# Patient Record
Sex: Female | Born: 1985 | Race: Black or African American | Hispanic: No | Marital: Married | Smoking: Never smoker
Health system: Southern US, Community
[De-identification: ages and names within clinical notes are randomized; demographics above are authoritative.]

## PROBLEM LIST (undated history)

## (undated) ENCOUNTER — Inpatient Hospital Stay (HOSPITAL_COMMUNITY): Payer: Self-pay

## (undated) DIAGNOSIS — J45909 Unspecified asthma, uncomplicated: Secondary | ICD-10-CM

## (undated) DIAGNOSIS — T8859XA Other complications of anesthesia, initial encounter: Secondary | ICD-10-CM

## (undated) HISTORY — PX: NO PAST SURGERIES: SHX2092

---

## 2007-08-20 ENCOUNTER — Ambulatory Visit: Payer: Self-pay | Admitting: Family Medicine

## 2007-09-05 ENCOUNTER — Ambulatory Visit: Payer: Self-pay | Admitting: Family Medicine

## 2012-02-20 ENCOUNTER — Encounter (HOSPITAL_COMMUNITY): Payer: Self-pay | Admitting: Emergency Medicine

## 2012-02-20 ENCOUNTER — Emergency Department (INDEPENDENT_AMBULATORY_CARE_PROVIDER_SITE_OTHER)
Admission: EM | Admit: 2012-02-20 | Discharge: 2012-02-20 | Disposition: A | Discharge status: Home / Self Care | Source: Home / Self Care

## 2012-02-20 DIAGNOSIS — B349 Viral infection, unspecified: Secondary | ICD-10-CM

## 2012-02-20 DIAGNOSIS — B9789 Other viral agents as the cause of diseases classified elsewhere: Secondary | ICD-10-CM

## 2012-02-20 DIAGNOSIS — J111 Influenza due to unidentified influenza virus with other respiratory manifestations: Secondary | ICD-10-CM

## 2012-02-20 MED ORDER — PHENYLEPHRINE-CHLORPHEN-DM 10-4-12.5 MG/5ML PO LIQD: 5.0000 mL | ORAL | Status: DC | PRN

## 2012-02-20 NOTE — ED Provider Notes (Signed)
Medical screening examination/treatment/procedure(s) were performed by resident physician or non-physician practitioner and as supervising physician I was immediately available for consultation/collaboration.   Carlena Ruybal DOUGLAS MD.    Vernel Langenderfer D Devynn Scheff, MD 02/20/12 1848 

## 2012-02-20 NOTE — ED Provider Notes (Signed)
History     CSN: [id number]  Arrival date & time 02/20/12  1235   None     Chief Complaint  Patient presents with  . URI    (Consider location/radiation/quality/duration/timing/severity/associated sxs/prior treatment) HPI Comments: 26 year old female presents with several days of myalgias, cough, sore throat, nasal congestion, malaise and decreased appetite. She denies earache or documented fever. Chest denies abdominal pain nausea vomiting or diarrhea.  Patient is a 26 y.o. female presenting with URI.  URI The primary symptoms include fever. Primary symptoms do not include fatigue or rash.  Symptoms associated with the illness include congestion and rhinorrhea. The illness is not associated with chills.    History reviewed. No pertinent past medical history.  History reviewed. No pertinent past surgical history.  History reviewed. No pertinent family history.  History  Substance Use Topics  . Smoking status: Never Smoker   . Smokeless tobacco: Not on file  . Alcohol Use: No    OB History    Grav Para Term Preterm Abortions TAB SAB Ect Mult Living                  Review of Systems  Constitutional: Positive for fever. Negative for chills, activity change, appetite change and fatigue.  HENT: Positive for congestion, rhinorrhea and postnasal drip. Negative for facial swelling, neck pain and neck stiffness.   Eyes: Negative.   Respiratory: Negative.   Cardiovascular: Negative.   Gastrointestinal: Negative.   Genitourinary: Negative.   Skin: Negative for pallor and rash.  Neurological: Negative.     Allergies  Review of patient's allergies indicates no known allergies.  Home Medications   Current Outpatient Rx  Name  Route  Sig  Dispense  Refill  . PHENYLEPHRINE-CHLORPHEN-DM 12-09-10.5 MG/5ML PO LIQD   Oral   Take 5 mLs by mouth every 4 (four) hours as needed.   120 mL   0     BP 111/82  Pulse 88  Temp 98.8 F (37.1 C) (Oral)  Resp 18  SpO2  98%  Physical Exam  Constitutional: She is oriented to person, place, and time. She appears well-developed and well-nourished. No distress.  HENT:  Mouth/Throat: No oropharyngeal exudate.       Bilateral TMs are normal Conjunctiva mildly injected but no swelling or exudates.  Eyes: Conjunctivae normal and EOM are normal.  Neck: Normal range of motion. Neck supple.  Cardiovascular: Normal rate, regular rhythm and normal heart sounds.   Pulmonary/Chest: Effort normal and breath sounds normal. No respiratory distress. She has no wheezes. She has no rales.  Abdominal: Soft. Bowel sounds are normal. There is no tenderness.  Musculoskeletal: Normal range of motion. She exhibits no edema.  Lymphadenopathy:    She has no cervical adenopathy.  Neurological: She is alert and oriented to person, place, and time. No cranial nerve deficit.  Skin: Skin is warm and dry. No rash noted.  Psychiatric: She has a normal mood and affect.    ED Course  Procedures (including critical care time)  Labs Reviewed - No data to display No results found.   1. Acute viral syndrome   2. Influenza-like illness       MDM  Appears mildly ill but certainly not toxic. She did not receive a flu shot this year. She is able to drink and hold fluids down to she is encouraged to drink plenty of fluids and stay well hydrated Norell CS 1 teaspoon every 4 hours when necessary cough and congestion Remain home for  the next 2-4 days get plenty of rest and take Tylenol every 4 hours as needed. Instructions on file illnesses and flulike illnesses.        Hayden Rasmussen, NP 02/20/12 1423

## 2012-02-20 NOTE — ED Notes (Signed)
Reports cold sx since Friday.  C/o cough, body ache, headache, and congestion.  OTC medication tried but no relief.

## 2012-10-05 ENCOUNTER — Encounter (HOSPITAL_COMMUNITY): Payer: Self-pay | Admitting: Emergency Medicine

## 2012-10-05 ENCOUNTER — Emergency Department (HOSPITAL_COMMUNITY)
Admission: EM | Admit: 2012-10-05 | Discharge: 2012-10-05 | Disposition: A | Discharge status: Home / Self Care | Attending: Emergency Medicine | Admitting: Emergency Medicine

## 2012-10-05 DIAGNOSIS — M791 Myalgia, unspecified site: Secondary | ICD-10-CM

## 2012-10-05 DIAGNOSIS — IMO0001 Reserved for inherently not codable concepts without codable children: Secondary | ICD-10-CM | POA: Insufficient documentation

## 2012-10-05 DIAGNOSIS — M542 Cervicalgia: Secondary | ICD-10-CM | POA: Insufficient documentation

## 2012-10-05 DIAGNOSIS — H9209 Otalgia, unspecified ear: Secondary | ICD-10-CM | POA: Insufficient documentation

## 2012-10-05 DIAGNOSIS — G44209 Tension-type headache, unspecified, not intractable: Secondary | ICD-10-CM | POA: Insufficient documentation

## 2012-10-05 DIAGNOSIS — M25519 Pain in unspecified shoulder: Secondary | ICD-10-CM | POA: Insufficient documentation

## 2012-10-05 MED ORDER — KETOROLAC TROMETHAMINE 60 MG/2ML IM SOLN
60.0000 mg | Freq: Once | INTRAMUSCULAR | Status: AC
  Administered 2012-10-05: 60 mg via INTRAMUSCULAR
  Filled 2012-10-05: qty 2

## 2012-10-05 MED ORDER — NAPROXEN 500 MG PO TABS: 500.0000 mg | ORAL_TABLET | Freq: Two times a day (BID) | ORAL | Status: DC

## 2012-10-05 NOTE — ED Provider Notes (Signed)
CSN: [id number]     Arrival date & time 10/05/12  1656 History  This chart was scribed for non-physician practitioner Rhea Bleacher, PA-C, working with Gilda Crease,  by Yevette Edwards, ED Scribe. This patient was seen in room TR06C/TR06C and the patient's care was started at 5:39 PM.   First MD Initiated Contact with Patient 10/05/12 1722     Chief Complaint  Patient presents with  . Otalgia  . Headache    HPI HPI Comments: Ashley [name] is a 27 y.o. female who presents to the Emergency Department complaining of gradual-onset pain to the left side of her head which began three days ago. She reports experiencing itching and burning to the left side of her head which she associated with a weave that she had, and which she then removed. However, the pain to the left side of her head has continued. The pt has also experienced a headache, right-sided otalgia, neck pain, and pain to her right shoulder. She denies experiencing any fever, rhinorrhea, congestion, sore throat, vision changes, hearing problems, cough, SOB, or dental pain. She also denies performing any activities which might have injured her head, neck, or shoulder.  The pt has attempted to treat her symptoms with IBU, but with no resolution.  She denies a h/o medical problems including kidney problems or allergies. The onset of this condition is acute. The course is constant. The aggravating factors are none. The alleviating factors are none.   History reviewed. No pertinent past medical history. History reviewed. No pertinent past surgical history. History reviewed. No pertinent family history. History  Substance Use Topics  . Smoking status: Never Smoker   . Smokeless tobacco: Not on file  . Alcohol Use: No   No OB history provided.  Review of Systems  Constitutional: Negative for fever and chills.  HENT: Positive for ear pain and neck pain. Negative for hearing loss, congestion, sore throat, rhinorrhea and dental  problem.   Eyes: Negative for visual disturbance.  Respiratory: Negative for cough and shortness of breath.   Musculoskeletal: Positive for myalgias and arthralgias.  Neurological: Positive for headaches.    Allergies  Review of patient's allergies indicates no known allergies.  Home Medications   Current Outpatient Rx  Name  Route  Sig  Dispense  Refill  . Phenylephrine-Chlorphen-DM 12-09-10.5 MG/5ML LIQD   Oral   Take 5 mLs by mouth every 4 (four) hours as needed.   120 mL   0    Triage Vitals: BP 105/75  Pulse 79  Temp(Src) 97.8 F (36.6 C) (Oral)  Resp 16  SpO2 98%  Physical Exam  Nursing note and vitals reviewed. Constitutional: She is oriented to person, place, and time. She appears well-developed and well-nourished.  HENT:  Head: Normocephalic and atraumatic. No trismus in the jaw.  Right Ear: Tympanic membrane, external ear and ear canal normal.  Left Ear: Tympanic membrane, external ear and ear canal normal.  Nose: Nose normal. No mucosal edema or rhinorrhea.  Mouth/Throat: Uvula is midline, oropharynx is clear and moist and mucous membranes are normal. Mucous membranes are not dry. No oral lesions. No edematous. No oropharyngeal exudate, posterior oropharyngeal edema, posterior oropharyngeal erythema or tonsillar abscesses.  Eyes: Conjunctivae, EOM and lids are normal. Pupils are equal, round, and reactive to light. Right eye exhibits no discharge. Left eye exhibits no discharge. Right eye exhibits no nystagmus. Left eye exhibits no nystagmus.  Neck: Normal range of motion. Neck supple.  Cardiovascular: Normal rate, regular rhythm  and normal heart sounds.   Pulmonary/Chest: Effort normal and breath sounds normal. No respiratory distress. She has no wheezes. She has no rales.  Abdominal: Soft. There is no tenderness.  Musculoskeletal:       Cervical back: She exhibits normal range of motion, no tenderness and no bony tenderness.  Tenderness to palpation of L scalp  which reproduces her headache pain.   Tenderness to R shoulder, trapezius, and paraspinous muscles.   Lymphadenopathy:    She has no cervical adenopathy.  Neurological: She is alert and oriented to person, place, and time. She has normal strength and normal reflexes. No cranial nerve deficit or sensory deficit. She displays a negative Romberg sign. Coordination and gait normal. GCS eye subscore is 4. GCS verbal subscore is 5. GCS motor subscore is 6.  Skin: Skin is warm and dry.  Psychiatric: She has a normal mood and affect.    ED Course   DIAGNOSTIC STUDIES:  Oxygen Saturation is 98% on room air, normal by my interpretation.    COORDINATION OF CARE:  5:42 PM- Discussed treatment plan with patient which includes Toradol and treatment for a tension-headache, and the patient agreed to the plan.   Procedures (including critical care time)  Labs Reviewed - No data to display No results found. 1. Tension headache   2. Myalgia    Patient seen and examined. Medications ordered.   Vital signs reviewed and are as follows: Filed Vitals:   10/05/12 1704  BP: 105/75  Pulse: 79  Temp: 97.8 F (36.6 C)  Resp: 16   5:49 PM Patient was counseled on symptoms that should indicate their return to the ED.  These include severe worsening headache, vision changes, confusion, loss of consciousness, trouble walking, nausea & vomiting, or weakness/tingling in extremities.      MDM  Patient with headache of tension-type. She also has diffuse muscle pain. No signs or symptoms consistent with SAH. No neurological deficit on exam. No upper respiratory tract infection symptoms. TMs are clear. No otitis externa. Will treat with anti-inflammatories. Patient appears well, nontoxic.  I personally performed the services described in this documentation, which was scribed in my presence. The recorded information has been reviewed and is accurate.     Renne Crigler, PA-C 10/05/12 1750

## 2012-10-05 NOTE — ED Notes (Signed)
Pt c/o right sided earache and HA x 3 days

## 2012-10-06 NOTE — ED Provider Notes (Signed)
Medical screening examination/treatment/procedure(s) were performed by non-physician practitioner and as supervising physician I was immediately available for consultation/collaboration.   Christopher J. Pollina, MD 10/06/12 1752 

## 2012-10-25 ENCOUNTER — Encounter: Payer: Self-pay | Admitting: Obstetrics & Gynecology

## 2012-11-15 ENCOUNTER — Encounter: Payer: Self-pay | Admitting: Obstetrics & Gynecology

## 2012-11-15 ENCOUNTER — Ambulatory Visit (INDEPENDENT_AMBULATORY_CARE_PROVIDER_SITE_OTHER): Payer: Medicaid Other | Admitting: Obstetrics & Gynecology

## 2012-11-15 VITALS — BP 119/81 | Temp 97.1°F | Ht 67.0 in | Wt 205.0 lb

## 2012-11-15 DIAGNOSIS — Z3480 Encounter for supervision of other normal pregnancy, unspecified trimester: Secondary | ICD-10-CM

## 2012-11-15 DIAGNOSIS — Z3201 Encounter for pregnancy test, result positive: Secondary | ICD-10-CM

## 2012-11-15 DIAGNOSIS — Z3481 Encounter for supervision of other normal pregnancy, first trimester: Secondary | ICD-10-CM

## 2012-11-15 MED ORDER — DOXYLAMINE-PYRIDOXINE 10-10 MG PO TBEC: 10.0000 mg | DELAYED_RELEASE_TABLET | Freq: Every day | ORAL | Status: DC

## 2012-11-15 NOTE — Patient Instructions (Signed)
Hyperemesis Gravidarum  Hyperemesis gravidarum is a severe form of nausea and vomiting that happens during pregnancy. Hyperemesis is worse than morning sickness. It may cause a woman to have nausea or vomiting all day for many days. It may keep a woman from eating and drinking enough food and liquids. Hyperemesis usually occurs during the first half (the first 20 weeks) of pregnancy. It often goes away once a woman is in her second half of pregnancy. However, sometimes hyperemesis continues through an entire pregnancy.   CAUSES   The cause of this condition is not completely known but is thought to be due to changes in the body's hormones when pregnant. It could be the high level of the pregnancy hormone or an increase in estrogen in the body.   SYMPTOMS    Severe nausea and vomiting.   Nausea that does not go away.   Vomiting that does not allow you to keep any food down.   Weight loss and body fluid loss (dehydration).   Having no desire to eat or not liking food you have previously enjoyed.  DIAGNOSIS   Your caregiver may ask you about your symptoms. Your caregiver may also order blood tests and urine tests to make sure something else is not causing the problem.   TREATMENT   You may only need medicine to control the problem. If medicines do not control the nausea and vomiting, you will be treated in the hospital to prevent dehydration, acidosis, weight loss, and changes in the electrolytes in your body that may harm the unborn baby (fetus). You may need intravenous (IV) fluids.   HOME CARE INSTRUCTIONS    Take all medicine as directed by your caregiver.   Try eating a couple of dry crackers or toast in the morning before getting out of bed.   Avoid foods and smells that upset your stomach.   Avoid fatty and spicy foods. Eat 5 to 6 small meals a day.   Do not drink when eating meals. Drink between meals.   For snacks, eat high protein foods, such as cheese. Eat or suck on things that have ginger in  them. Ginger helps nausea.   Avoid food preparation. The smell of food can spoil your appetite.   Avoid iron pills and iron in your multivitamins until after 3 to 4 months of being pregnant.  SEEK MEDICAL CARE IF:    Your abdominal pain increases since the last time you saw your caregiver.   You have a severe headache.   You develop vision problems.   You feel you are losing weight.  SEEK IMMEDIATE MEDICAL CARE IF:    You are unable to keep fluids down.   You vomit blood.   You have constant nausea and vomiting.   You have a fever.   You have excessive weakness, dizziness, fainting, or extreme thirst.  MAKE SURE YOU:    Understand these instructions.   Will watch your condition.   Will get help right away if you are not doing well or get worse.  Document Released: 02/21/2005 Document Revised: 05/16/2011 Document Reviewed: 05/24/2010  [employer] Patient Information 2014 [employer].

## 2012-11-15 NOTE — Progress Notes (Signed)
P-72 Pressure in lower back, pressure in vagina and pressure in her bottom.  Subjective:    Ashley [name] is being seen today for her first obstetrical visit.  This is not a planned pregnancy. She is at [redacted]w[redacted]d gestation. Her obstetrical history is significant for none. Relationship with FOB: significant other, living together. Patient does intend to breast feed. Pregnancy history fully reviewed.  Menstrual History: OB History   Grav Para Term Preterm Abortions TAB SAB Ect Mult Living   3 1 1  1  1   1       Last Pap: 2013 Normal Menarche age: 7 Regular   Patient's last menstrual period was 10/18/2012.    The following portions of the patient's history were reviewed and updated as appropriate: allergies, current medications, past family history, past medical history, past social history, past surgical history and problem list.  Review of Systems Pertinent items are noted in HPI.    Objective:   No exam  Assessment:    Pregnancy at ?gestational age  Plan:    Initial labs drawn. Prenatal vitamins.  Counseling provided regarding continued use of seat belts, cessation of alcohol consumption, smoking or use of illicit drugs; infection precautions i.e., influenza/TDAP immunizations, toxoplasmosis,CMV, parvovirus, listeria and varicella; workplace safety, exercise during pregnancy; routine dental care, safe medications, sexual activity, hot tubs, saunas, pools, travel, caffeine use, fish and methlymercury, potential toxins, hair treatments, varicose veins Problem list reviewed and updated. Amniocentesis discussed: not indicated. Follow up after the U/S 100% of 15 min visit spent on counseling and coordination of care.

## 2012-11-19 ENCOUNTER — Encounter: Payer: Self-pay | Admitting: Obstetrics & Gynecology

## 2012-11-28 ENCOUNTER — Ambulatory Visit (INDEPENDENT_AMBULATORY_CARE_PROVIDER_SITE_OTHER)

## 2012-11-28 ENCOUNTER — Other Ambulatory Visit: Payer: Self-pay | Admitting: Obstetrics & Gynecology

## 2012-11-28 DIAGNOSIS — O3680X1 Pregnancy with inconclusive fetal viability, fetus 1: Secondary | ICD-10-CM

## 2012-11-28 DIAGNOSIS — O3660X0 Maternal care for excessive fetal growth, unspecified trimester, not applicable or unspecified: Secondary | ICD-10-CM

## 2012-11-29 ENCOUNTER — Encounter: Payer: Self-pay | Admitting: Obstetrics & Gynecology

## 2012-11-29 LAB — US OB DETAIL + 14 WK

## 2012-12-06 ENCOUNTER — Ambulatory Visit (INDEPENDENT_AMBULATORY_CARE_PROVIDER_SITE_OTHER): Payer: Medicaid Other | Admitting: Obstetrics & Gynecology

## 2012-12-06 VITALS — BP 103/71 | Temp 98.3°F | Wt 207.2 lb

## 2012-12-06 DIAGNOSIS — Z3481 Encounter for supervision of other normal pregnancy, first trimester: Secondary | ICD-10-CM

## 2012-12-06 DIAGNOSIS — Z3480 Encounter for supervision of other normal pregnancy, unspecified trimester: Secondary | ICD-10-CM

## 2012-12-06 LAB — POCT URINALYSIS DIPSTICK
Blood, UA: NEGATIVE
Leukocytes, UA: NEGATIVE
Nitrite, UA: NEGATIVE
Protein, UA: NEGATIVE
pH, UA: 8

## 2012-12-06 MED ORDER — OB COMPLETE PETITE 35-5-1-200 MG PO CAPS: 1.0000 | ORAL_CAPSULE | Freq: Every day | ORAL | Status: DC

## 2012-12-06 NOTE — Progress Notes (Signed)
Pulse 85.  Little spotting after her ultrasound.  Cardiac activity on U/S.

## 2012-12-07 ENCOUNTER — Encounter: Payer: Self-pay | Admitting: Obstetrics & Gynecology

## 2012-12-10 ENCOUNTER — Ambulatory Visit: Payer: Self-pay | Admitting: Obstetrics & Gynecology

## 2012-12-28 ENCOUNTER — Encounter: Payer: Self-pay | Admitting: Obstetrics

## 2013-01-03 ENCOUNTER — Encounter (HOSPITAL_COMMUNITY): Payer: Self-pay | Admitting: Obstetrics & Gynecology

## 2013-01-03 ENCOUNTER — Encounter: Payer: Self-pay | Admitting: Obstetrics & Gynecology

## 2013-01-03 ENCOUNTER — Ambulatory Visit (INDEPENDENT_AMBULATORY_CARE_PROVIDER_SITE_OTHER): Admitting: Obstetrics & Gynecology

## 2013-01-03 ENCOUNTER — Other Ambulatory Visit: Payer: Self-pay | Admitting: Obstetrics & Gynecology

## 2013-01-03 VITALS — BP 128/75 | Temp 98.2°F | Wt 207.0 lb

## 2013-01-03 DIAGNOSIS — Z3682 Encounter for antenatal screening for nuchal translucency: Secondary | ICD-10-CM

## 2013-01-03 DIAGNOSIS — Z3481 Encounter for supervision of other normal pregnancy, first trimester: Secondary | ICD-10-CM

## 2013-01-03 DIAGNOSIS — O219 Vomiting of pregnancy, unspecified: Secondary | ICD-10-CM

## 2013-01-03 DIAGNOSIS — Z3480 Encounter for supervision of other normal pregnancy, unspecified trimester: Secondary | ICD-10-CM

## 2013-01-03 LAB — POCT URINALYSIS DIPSTICK
Bilirubin, UA: NEGATIVE
Glucose, UA: NEGATIVE
Ketones, UA: NEGATIVE
Leukocytes, UA: NEGATIVE
Nitrite, UA: NEGATIVE
pH, UA: 6

## 2013-01-03 NOTE — Progress Notes (Signed)
HR - 90 Pt in office for routine OB visit, reports needs to get prescription for nausea medication, having pain to lower back, would like some educational material on food to eat during pregnancy.  CF mutation testing done today.  Referral made for [facility].

## 2013-01-03 NOTE — Patient Instructions (Signed)
Morning Sickness Morning sickness is when you feel sick to your stomach (nauseous) during pregnancy. This nauseous feeling may or may not come with throwing up (vomiting). It often occurs in the morning, but can be a problem any time of day. While morning sickness is unpleasant, it is usually harmless unless you develop severe and continual vomiting (hyperemesis gravidarum). This condition requires more intense treatment. CAUSES  The cause of morning sickness is not completely known but seems to be related to a sudden increase of two hormones:   Human chorionic gonadotropin (hCG).  Estrogen hormone. These are elevated in the first part of the pregnancy. TREATMENT  Do not use any medicines (prescription, over-the-counter, or herbal) for morning sickness without first talking to your caregiver. Some patients are helped by the following:  Vitamin B6 (25mg  every 8 hours) or vitamin B6 shots.  An antihistamine called doxylamine (10mg  every 8 hours).  The herbal medication ginger. HOME CARE INSTRUCTIONS   Taking multivitamins before getting pregnant can prevent or decrease the severity of morning sickness in most women.  Eat a piece of dry toast or unsalted crackers before getting out of bed in the morning.  Eat 5 or 6 small meals a day.  Eat dry and bland foods (rice, baked potato).  Do not drink liquids with your meals. Drink liquids between meals.  Avoid greasy, fatty, and spicy foods.  Get someone to cook for you if the smell of any food causes nausea and vomiting.  Avoid vitamin pills with iron because iron can cause nausea.  Snack on protein foods between meals if you are hungry.  Eat unsweetened gelatins for deserts.  Wear an acupressure wristband (worn for sea sickness) may be helpful.  Acupuncture may be helpful.  Do not smoke.  Get a humidifier to keep the air in your house free of odors. SEEK MEDICAL CARE IF:   Your home remedies are not working and you need  medication.  You feel dizzy or lightheaded.  You are losing weight.  You need help with your diet. SEEK IMMEDIATE MEDICAL CARE IF:   You have persistent and uncontrolled nausea and vomiting.  You pass out (faint).  You have a fever. MAKE SURE YOU:   Understand these instructions.  Will watch your condition.  Will get help right away if you are not doing well or get worse. Document Released: 04/14/2006 Document Revised: 05/16/2011 Document Reviewed: 02/09/2007 [facility] Patient Information 2014 [place], [place]. CF Gene Mutation Testing This is a test used to detect cystic fibrosis (CF) genetic mutations to establish CF carrier status or to establish the diagnosis of CF in an individual. The CF gene mutation test identifies mutations in the CFTR gene on chromosome 7. Each cell in the human body (except sperm and eggs) has 46 chromosomes (23 inherited from the mother and 23 from the father). Genes on these chromosomes form the body's blueprint for producing proteins that control body functions. Cystic fibrosis is caused by a mutation in a pair of genes located on chromosomes 7. Both copies of this gene must be abnormal to cause CF. If only one copy of the gene pair is mutated, the patient will be a carrier. Carriers are not ill, they do not have any symptoms, but they can pass their abnormal CF gene copy on to their children.  When a newborn infant has meconium ileus (no stools in the first 24 to 48 hours of life) or when a person has symptoms of CF (salty sweat, persistent respiratory  infections, wheezing, persistent diarrhea, foul-smelling greasy stools, malnutrition, and vitamin deficiency); if a person has a positive sweat chloride or IRT test or a close relative who has been diagnosed with CF; when a patient is undergoing genetic counseling and wants to find out if they are a CF carrier; or for prenatal diagnosis. PREPARATION FOR TEST A blood sample drawn from an infant's heel; a spot of  blood that is put onto filter paper; or a blood sample is drawn from a vein in the arm. NORMAL FINDINGS No genetic mutation. Ranges for normal findings may vary among different laboratories and hospitals. You should always check with your doctor after having lab work or other tests done to discuss the meaning of your test results and whether your values are considered within normal limits. MEANING OF TEST Your caregiver will go over the test results with you and discuss the importance and meaning of your results, as well as treatment options and the need for additional tests if necessary. OBTAINING THE TEST RESULTS It is your responsibility to obtain your test results. Ask the lab or department performing the test when and how you will get your results. Document Released: 03/17/2004 Document Revised: 05/16/2011 Document Reviewed: 01/30/2008 [facility] Patient Information 2014 [place], [place].

## 2013-01-04 LAB — OBSTETRIC PANEL
Antibody Screen: NEGATIVE
Basophils Absolute: 0 10*3/uL (ref 0.0–0.1)
Basophils Relative: 0 % (ref 0–1)
Eosinophils Absolute: 0.3 10*3/uL (ref 0.0–0.7)
HCT: 37.1 % (ref 36.0–46.0)
MCH: 30.2 pg (ref 26.0–34.0)
MCHC: 35.6 g/dL (ref 30.0–36.0)
Monocytes Absolute: 0.4 10*3/uL (ref 0.1–1.0)
Neutro Abs: 6.7 10*3/uL (ref 1.7–7.7)
Neutrophils Relative %: 69 % (ref 43–77)
RDW: 13.7 % (ref 11.5–15.5)
Rh Type: POSITIVE

## 2013-01-04 LAB — VARICELLA ZOSTER ANTIBODY, IGG: Varicella IgG: 136.9 Index — ABNORMAL HIGH (ref <135.00)

## 2013-01-05 LAB — CULTURE, OB URINE: Organism ID, Bacteria: NO GROWTH

## 2013-01-07 LAB — HEMOGLOBINOPATHY EVALUATION
Hgb A2 Quant: 3 % (ref 2.2–3.2)
Hgb A: 97 % (ref 96.8–97.8)
Hgb F Quant: 0 % (ref 0.0–2.0)

## 2013-01-11 ENCOUNTER — Other Ambulatory Visit: Payer: Self-pay | Admitting: Obstetrics & Gynecology

## 2013-01-11 ENCOUNTER — Ambulatory Visit (HOSPITAL_COMMUNITY): Admission: RE | Admit: 2013-01-11 | Source: Ambulatory Visit

## 2013-01-11 ENCOUNTER — Ambulatory Visit (HOSPITAL_COMMUNITY)
Admission: RE | Admit: 2013-01-11 | Discharge: 2013-01-11 | Disposition: A | Discharge status: Home / Self Care | Source: Ambulatory Visit | Attending: Obstetrics & Gynecology | Admitting: Obstetrics & Gynecology

## 2013-01-11 DIAGNOSIS — Z3682 Encounter for antenatal screening for nuchal translucency: Secondary | ICD-10-CM

## 2013-01-11 DIAGNOSIS — O3510X0 Maternal care for (suspected) chromosomal abnormality in fetus, unspecified, not applicable or unspecified: Secondary | ICD-10-CM | POA: Insufficient documentation

## 2013-01-11 DIAGNOSIS — Z3689 Encounter for other specified antenatal screening: Secondary | ICD-10-CM | POA: Insufficient documentation

## 2013-01-11 DIAGNOSIS — O351XX0 Maternal care for (suspected) chromosomal abnormality in fetus, not applicable or unspecified: Secondary | ICD-10-CM | POA: Insufficient documentation

## 2013-01-17 ENCOUNTER — Other Ambulatory Visit: Payer: Self-pay | Admitting: Obstetrics & Gynecology

## 2013-01-17 ENCOUNTER — Ambulatory Visit (INDEPENDENT_AMBULATORY_CARE_PROVIDER_SITE_OTHER): Admitting: Obstetrics & Gynecology

## 2013-01-17 VITALS — BP 108/74 | Temp 98.0°F | Wt 212.0 lb

## 2013-01-17 DIAGNOSIS — Z3481 Encounter for supervision of other normal pregnancy, first trimester: Secondary | ICD-10-CM

## 2013-01-17 DIAGNOSIS — Z0489 Encounter for examination and observation for other specified reasons: Secondary | ICD-10-CM

## 2013-01-17 DIAGNOSIS — Z3480 Encounter for supervision of other normal pregnancy, unspecified trimester: Secondary | ICD-10-CM

## 2013-01-17 DIAGNOSIS — Z1389 Encounter for screening for other disorder: Secondary | ICD-10-CM

## 2013-01-17 LAB — POCT URINALYSIS DIPSTICK
Bilirubin, UA: NEGATIVE
Blood, UA: NEGATIVE
Glucose, UA: NEGATIVE
Nitrite, UA: NEGATIVE
Urobilinogen, UA: NEGATIVE

## 2013-01-17 MED ORDER — OB COMPLETE PETITE 35-5-1-200 MG PO CAPS: 1.0000 | ORAL_CAPSULE | Freq: Every day | ORAL | Status: DC

## 2013-01-17 MED ORDER — DOXYLAMINE-PYRIDOXINE 10-10 MG PO TBEC: DELAYED_RELEASE_TABLET | ORAL | Status: DC

## 2013-01-17 NOTE — Progress Notes (Signed)
HR - 77 Doing well.

## 2013-01-18 ENCOUNTER — Encounter: Payer: Self-pay | Admitting: Obstetrics & Gynecology

## 2013-01-18 NOTE — Patient Instructions (Signed)
Second Trimester of Pregnancy °The second trimester is from week 13 through week 28, month 4 through 6. This is often the time in pregnancy that you feel your best. Often times, morning sickness has lessened or quit. You may have more energy, and you may get hungry more often. Your unborn baby (fetus) is growing rapidly. At the end of the sixth month, he or she is about 9 inches long and weighs about 1½ pounds. You will likely feel the baby move (quickening) between 18 and 20 weeks of pregnancy. °HOME CARE  °· Avoid all smoking, herbs, and alcohol. Avoid drugs not approved by your doctor. °· Only take medicine as told by your doctor. Some medicines are safe and some are not during pregnancy. °· Exercise only as told by your doctor. Stop exercising if you start having cramps. °· Eat regular, healthy meals. °· Wear a good support bra if your breasts are tender. °· Do not use hot tubs, steam rooms, or saunas. °· Wear your seat belt when driving. °· Avoid raw meat, uncooked cheese, and liter boxes and soil used by cats. °· Take your prenatal vitamins. °· Try taking medicine that helps you poop (stool softener) as needed, and if your doctor approves. Eat more fiber by eating fresh fruit, vegetables, and whole grains. Drink enough fluids to keep your pee (urine) clear or pale yellow. °· Take warm water baths (sitz baths) to soothe pain or discomfort caused by hemorrhoids. Use hemorrhoid cream if your doctor approves. °· If you have puffy, bulging veins (varicose veins), wear support hose. Raise (elevate) your feet for 15 minutes, 3 4 times a day. Limit salt in your diet. °· Avoid heavy lifting, wear low heals, and sit up straight. °· Rest with your legs raised if you have leg cramps or low back pain. °· Visit your dentist if you have not gone during your pregnancy. Use a soft toothbrush to brush your teeth. Be gentle when you floss. °· You can have sex (intercourse) unless your doctor tells you not to. °· Go to your  doctor visits. °GET HELP IF:  °· You feel dizzy. °· You have mild cramps or pressure in your lower belly (abdomen). °· You have a nagging pain in your belly area. °· You continue to feel sick to your stomach (nauseous), throw up (vomit), or have watery poop (diarrhea). °· You have bad smelling fluid coming from your vagina. °· You have pain with peeing (urination). °GET HELP RIGHT AWAY IF:  °· You have a fever. °· You are leaking fluid from your vagina. °· You have spotting or bleeding from your vagina. °· You have severe belly cramping or pain. °· You lose or gain weight rapidly. °· You have trouble catching your breath and have chest pain. °· You notice sudden or extreme puffiness (swelling) of your face, hands, ankles, feet, or legs. °· You have not felt the baby move in over an hour. °· You have severe headaches that do not go away with medicine. °· You have vision changes. °Document Released: 05/18/2009 Document Revised: 06/18/2012 Document Reviewed: 04/24/2012 °[employer]® Patient Information ©2014 [employer]. ° °

## 2013-01-25 ENCOUNTER — Ambulatory Visit (HOSPITAL_COMMUNITY)
Admission: RE | Admit: 2013-01-25 | Discharge: 2013-01-25 | Disposition: A | Discharge status: Home / Self Care | Payer: Medicaid Other | Source: Ambulatory Visit | Attending: Obstetrics & Gynecology | Admitting: Obstetrics & Gynecology

## 2013-01-25 ENCOUNTER — Other Ambulatory Visit: Payer: Self-pay | Admitting: Obstetrics & Gynecology

## 2013-01-25 DIAGNOSIS — Z1389 Encounter for screening for other disorder: Secondary | ICD-10-CM | POA: Insufficient documentation

## 2013-01-25 DIAGNOSIS — O358XX0 Maternal care for other (suspected) fetal abnormality and damage, not applicable or unspecified: Secondary | ICD-10-CM | POA: Insufficient documentation

## 2013-01-25 DIAGNOSIS — Z0489 Encounter for examination and observation for other specified reasons: Secondary | ICD-10-CM

## 2013-01-25 DIAGNOSIS — Z363 Encounter for antenatal screening for malformations: Secondary | ICD-10-CM | POA: Insufficient documentation

## 2013-02-14 ENCOUNTER — Ambulatory Visit (INDEPENDENT_AMBULATORY_CARE_PROVIDER_SITE_OTHER): Admitting: Obstetrics & Gynecology

## 2013-02-14 VITALS — BP 113/74 | Temp 97.7°F | Wt 212.0 lb

## 2013-02-14 DIAGNOSIS — Z3480 Encounter for supervision of other normal pregnancy, unspecified trimester: Secondary | ICD-10-CM

## 2013-02-14 DIAGNOSIS — Z3482 Encounter for supervision of other normal pregnancy, second trimester: Secondary | ICD-10-CM

## 2013-02-14 LAB — POCT URINALYSIS DIPSTICK
Glucose, UA: NEGATIVE
Nitrite, UA: NEGATIVE
Protein, UA: NEGATIVE
Spec Grav, UA: 1.01
Urobilinogen, UA: NEGATIVE

## 2013-02-14 NOTE — Progress Notes (Signed)
Pulse 90 Pt states that she is doing well. She does have some back aches and fatigue.

## 2013-02-15 NOTE — Patient Instructions (Signed)
Glucose Tolerance Test This is a test to see how your body processes carbohydrates. This test is often done to check patients for diabetes or the possibility of developing it. PREPARATION FOR TEST You should have nothing to eat or drink 12 hours before the test. You will be given a form of sugar (glucose) and then blood samples will be drawn from your vein to determine the level of sugar in your blood. Alternatively, blood may be drawn from your finger for testing. You should not smoke or exercise during the test. NORMAL FINDINGS  Fasting: 70-115 mg/dL  30 minutes: less than 200 mg/dL  1 hour: less than 200 mg/dL  2 hours: less than 140 mg/dL  3 hours: 70-115 mg/dL  4 hours: 70-115 mg/dL Ranges for normal findings may vary among different laboratories and hospitals. You should always check with your doctor after having lab work or other tests done to discuss the meaning of your test results and whether your values are considered within normal limits. MEANING OF TEST Your caregiver will go over the test results with you and discuss the importance and meaning of your results, as well as treatment options and the need for additional tests. OBTAINING THE TEST RESULTS It is your responsibility to obtain your test results. Ask the lab or department performing the test when and how you will get your results. Document Released: 03/16/2004 Document Revised: 05/16/2011 Document Reviewed: 02/02/2008 [employer] Patient Information 2014 [employer].  

## 2013-02-25 ENCOUNTER — Other Ambulatory Visit: Payer: Medicaid Other

## 2013-02-25 ENCOUNTER — Ambulatory Visit (INDEPENDENT_AMBULATORY_CARE_PROVIDER_SITE_OTHER): Admitting: Obstetrics & Gynecology

## 2013-02-25 VITALS — BP 116/76 | Temp 98.3°F | Wt 212.0 lb

## 2013-02-25 DIAGNOSIS — Z3482 Encounter for supervision of other normal pregnancy, second trimester: Secondary | ICD-10-CM

## 2013-02-25 DIAGNOSIS — Z3480 Encounter for supervision of other normal pregnancy, unspecified trimester: Secondary | ICD-10-CM

## 2013-02-25 LAB — POCT URINALYSIS DIPSTICK
Bilirubin, UA: NEGATIVE
Blood, UA: NEGATIVE
Glucose, UA: NEGATIVE
Urobilinogen, UA: NEGATIVE

## 2013-02-25 LAB — CBC
HCT: 36.4 % (ref 36.0–46.0)
Hemoglobin: 12.6 g/dL (ref 12.0–15.0)
MCHC: 34.6 g/dL (ref 30.0–36.0)
RDW: 15 % (ref 11.5–15.5)

## 2013-02-25 NOTE — Progress Notes (Signed)
Pulse 96 Pt states that she is having some aches in back and shoulders. Pt denies any sinus symptoms. Pt also states hasn't been able to eat much lately.

## 2013-02-26 LAB — HIV ANTIBODY (ROUTINE TESTING W REFLEX): HIV: NONREACTIVE

## 2013-02-26 LAB — GLUCOSE TOLERANCE, 2 HOURS W/ 1HR
Glucose, 1 hour: 88 mg/dL (ref 70–170)
Glucose, 2 hour: 36 mg/dL — CL (ref 70–139)
Glucose, Fasting: 73 mg/dL (ref 70–99)

## 2013-02-27 ENCOUNTER — Encounter: Admitting: Obstetrics & Gynecology

## 2013-02-27 ENCOUNTER — Other Ambulatory Visit

## 2013-02-27 NOTE — Patient Instructions (Signed)
Second Trimester of Pregnancy The second trimester is from week 13 through week 28, months 4 through 6. The second trimester is often a time when you feel your best. Your body has also adjusted to being pregnant, and you begin to feel better physically. Usually, morning sickness has lessened or quit completely, you may have more energy, and you may have an increase in appetite. The second trimester is also a time when the fetus is growing rapidly. At the end of the sixth month, the fetus is about 9 inches long and weighs about 1 pounds. You will likely begin to feel the baby move (quickening) between 18 and 20 weeks of the pregnancy. BODY CHANGES Your body goes through many changes during pregnancy. The changes vary from woman to woman.   Your weight will continue to increase. You will notice your lower abdomen bulging out.  You may begin to get stretch marks on your hips, abdomen, and breasts.  You may develop headaches that can be relieved by medicines approved by your caregiver.  You may urinate more often because the fetus is pressing on your bladder.  You may develop or continue to have heartburn as a result of your pregnancy.  You may develop constipation because certain hormones are causing the muscles that push waste through your intestines to slow down.  You may develop hemorrhoids or swollen, bulging veins (varicose veins).  You may have back pain because of the weight gain and pregnancy hormones relaxing your joints between the bones in your pelvis and as a result of a shift in weight and the muscles that support your balance.  Your breasts will continue to grow and be tender.  Your gums may bleed and may be sensitive to brushing and flossing.  Dark spots or blotches (chloasma, mask of pregnancy) may develop on your face. This will likely fade after the baby is born.  A dark line from your belly button to the pubic area (linea nigra) may appear. This will likely fade after the  baby is born. WHAT TO EXPECT AT YOUR PRENATAL VISITS During a routine prenatal visit:  You will be weighed to make sure you and the fetus are growing normally.  Your blood pressure will be taken.  Your abdomen will be measured to track your baby's growth.  The fetal heartbeat will be listened to.  Any test results from the previous visit will be discussed. Your caregiver may ask you:  How you are feeling.  If you are feeling the baby move.  If you have had any abnormal symptoms, such as leaking fluid, bleeding, severe headaches, or abdominal cramping.  If you have any questions. Other tests that may be performed during your second trimester include:  Blood tests that check for:  Low iron levels (anemia).  Gestational diabetes (between 24 and 28 weeks).  Rh antibodies.  Urine tests to check for infections, diabetes, or protein in the urine.  An ultrasound to confirm the proper growth and development of the baby.  An amniocentesis to check for possible genetic problems.  Fetal screens for spina bifida and Down syndrome. HOME CARE INSTRUCTIONS   Avoid all smoking, herbs, alcohol, and unprescribed drugs. These chemicals affect the formation and growth of the baby.  Follow your caregiver's instructions regarding medicine use. There are medicines that are either safe or unsafe to take during pregnancy.  Exercise only as directed by your caregiver. Experiencing uterine cramps is a good sign to stop exercising.  Continue to eat regular,   healthy meals.  Wear a good support bra for breast tenderness.  Do not use hot tubs, steam rooms, or saunas.  Wear your seat belt at all times when driving.  Avoid raw meat, uncooked cheese, cat litter boxes, and soil used by cats. These carry germs that can cause birth defects in the baby.  Take your prenatal vitamins.  Try taking a stool softener (if your caregiver approves) if you develop constipation. Eat more high-fiber foods,  such as fresh vegetables or fruit and whole grains. Drink plenty of fluids to keep your urine clear or pale yellow.  Take warm sitz baths to soothe any pain or discomfort caused by hemorrhoids. Use hemorrhoid cream if your caregiver approves.  If you develop varicose veins, wear support hose. Elevate your feet for 15 minutes, 3 4 times a day. Limit salt in your diet.  Avoid heavy lifting, wear low heel shoes, and practice good posture.  Rest with your legs elevated if you have leg cramps or low back pain.  Visit your dentist if you have not gone yet during your pregnancy. Use a soft toothbrush to brush your teeth and be gentle when you floss.  A sexual relationship may be continued unless your caregiver directs you otherwise.  Continue to go to all your prenatal visits as directed by your caregiver. SEEK MEDICAL CARE IF:   You have dizziness.  You have mild pelvic cramps, pelvic pressure, or nagging pain in the abdominal area.  You have persistent nausea, vomiting, or diarrhea.  You have a bad smelling vaginal discharge.  You have pain with urination. SEEK IMMEDIATE MEDICAL CARE IF:   You have a fever.  You are leaking fluid from your vagina.  You have spotting or bleeding from your vagina.  You have severe abdominal cramping or pain.  You have rapid weight gain or loss.  You have shortness of breath with chest pain.  You notice sudden or extreme swelling of your face, hands, ankles, feet, or legs.  You have not felt your baby move in over an hour.  You have severe headaches that do not go away with medicine.  You have vision changes. Document Released: 02/15/2001 Document Revised: 10/24/2012 Document Reviewed: 04/24/2012 [employer] Patient Information 2014 [employer].  

## 2013-03-07 NOTE — L&D Delivery Note (Signed)
Delivery Note At 5:17 AM a viable female was delivered via Vaginal, Spontaneous Delivery (Presentation: ;  ).  APGAR: 9, 9; weight .   Placenta status: Intact, Spontaneous.  Cord: 3 vessels with the following complications: None.  Cord pH: none  Anesthesia: None  Episiotomy: None Lacerations: None Suture Repair: none Est. Blood Loss (mL): 350  Mom to postpartum.  Baby to Couplet care / Skin to Skin.  Ashley [name] 06/21/2013, 5:54 AM

## 2013-03-20 ENCOUNTER — Encounter: Payer: Medicaid Other | Admitting: Obstetrics & Gynecology

## 2013-03-25 ENCOUNTER — Encounter: Admitting: Obstetrics & Gynecology

## 2013-04-04 ENCOUNTER — Ambulatory Visit (INDEPENDENT_AMBULATORY_CARE_PROVIDER_SITE_OTHER): Payer: Medicaid Other | Admitting: Obstetrics & Gynecology

## 2013-04-04 VITALS — BP 109/74 | Temp 98.5°F | Wt 219.0 lb

## 2013-04-04 DIAGNOSIS — Z23 Encounter for immunization: Secondary | ICD-10-CM

## 2013-04-04 DIAGNOSIS — Z3480 Encounter for supervision of other normal pregnancy, unspecified trimester: Secondary | ICD-10-CM

## 2013-04-04 LAB — POCT URINALYSIS DIPSTICK
BILIRUBIN UA: NEGATIVE
GLUCOSE UA: NEGATIVE
Ketones, UA: NEGATIVE
Leukocytes, UA: NEGATIVE
NITRITE UA: NEGATIVE
Protein, UA: NEGATIVE
RBC UA: NEGATIVE
Spec Grav, UA: 1.01
Urobilinogen, UA: NEGATIVE
pH, UA: 8

## 2013-04-04 NOTE — Progress Notes (Signed)
Pulse: 94 Patient states she is having a lot of pelvic pressure and is having pressure in her feet. Patient denies any swelling in her feet and ankles. Patient states she is having a lot of body aches. Counseled re: support measures for musculoskeletal pain in pregnancy.  Declines a contraceptive method postpartum.

## 2013-04-04 NOTE — Patient Instructions (Addendum)
Third Trimester of Pregnancy The third trimester is from week 29 through week 42, months 7 through 9. The third trimester is a time when the fetus is growing rapidly. At the end of the ninth month, the fetus is about 20 inches in length and weighs 6 10 pounds.  BODY CHANGES Your body goes through many changes during pregnancy. The changes vary from woman to woman.   Your weight will continue to increase. You can expect to gain 25 35 pounds (11 16 kg) by the end of the pregnancy.  You may begin to get stretch marks on your hips, abdomen, and breasts.  You may urinate more often because the fetus is moving lower into your pelvis and pressing on your bladder.  You may develop or continue to have heartburn as a result of your pregnancy.  You may develop constipation because certain hormones are causing the muscles that push waste through your intestines to slow down.  You may develop hemorrhoids or swollen, bulging veins (varicose veins).  You may have pelvic pain because of the weight gain and pregnancy hormones relaxing your joints between the bones in your pelvis. Back aches may result from over exertion of the muscles supporting your posture.  Your breasts will continue to grow and be tender. A yellow discharge may leak from your breasts called colostrum.  Your belly button may stick out.  You may feel short of breath because of your expanding uterus.  You may notice the fetus "dropping," or moving lower in your abdomen.  You may have a bloody mucus discharge. This usually occurs a few days to a week before labor begins.  Your cervix becomes thin and soft (effaced) near your due date. WHAT TO EXPECT AT YOUR PRENATAL EXAMS  You will have prenatal exams every 2 weeks until week 36. Then, you will have weekly prenatal exams. During a routine prenatal visit:  You will be weighed to make sure you and the fetus are growing normally.  Your blood pressure is taken.  Your abdomen will be  measured to track your baby's growth.  The fetal heartbeat will be listened to.  Any test results from the previous visit will be discussed.  You may have a cervical check near your due date to see if you have effaced. At around 36 weeks, your caregiver will check your cervix. At the same time, your caregiver will also perform a test on the secretions of the vaginal tissue. This test is to determine if a type of bacteria, Group B streptococcus, is present. Your caregiver will explain this further. Your caregiver may ask you:  What your birth plan is.  How you are feeling.  If you are feeling the baby move.  If you have had any abnormal symptoms, such as leaking fluid, bleeding, severe headaches, or abdominal cramping.  If you have any questions. Other tests or screenings that may be performed during your third trimester include:  Blood tests that check for low iron levels (anemia).  Fetal testing to check the health, activity level, and growth of the fetus. Testing is done if you have certain medical conditions or if there are problems during the pregnancy. FALSE LABOR You may feel small, irregular contractions that eventually go away. These are called Braxton Hicks contractions, or false labor. Contractions may last for hours, days, or even weeks before true labor sets in. If contractions come at regular intervals, intensify, or become painful, it is best to be seen by your caregiver.    SIGNS OF LABOR   Menstrual-like cramps.  Contractions that are 5 minutes apart or less.  Contractions that start on the top of the uterus and spread down to the lower abdomen and back.  A sense of increased pelvic pressure or back pain.  A watery or bloody mucus discharge that comes from the vagina. If you have any of these signs before the 37th week of pregnancy, call your caregiver right away. You need to go to the hospital to get checked immediately. HOME CARE INSTRUCTIONS   Avoid all  smoking, herbs, alcohol, and unprescribed drugs. These chemicals affect the formation and growth of the baby.  Follow your caregiver's instructions regarding medicine use. There are medicines that are either safe or unsafe to take during pregnancy.  Exercise only as directed by your caregiver. Experiencing uterine cramps is a good sign to stop exercising.  Continue to eat regular, healthy meals.  Wear a good support bra for breast tenderness.  Do not use hot tubs, steam rooms, or saunas.  Wear your seat belt at all times when driving.  Avoid raw meat, uncooked cheese, cat litter boxes, and soil used by cats. These carry germs that can cause birth defects in the baby.  Take your prenatal vitamins.  Try taking a stool softener (if your caregiver approves) if you develop constipation. Eat more high-fiber foods, such as fresh vegetables or fruit and whole grains. Drink plenty of fluids to keep your urine clear or pale yellow.  Take warm sitz baths to soothe any pain or discomfort caused by hemorrhoids. Use hemorrhoid cream if your caregiver approves.  If you develop varicose veins, wear support hose. Elevate your feet for 15 minutes, 3 4 times a day. Limit salt in your diet.  Avoid heavy lifting, wear low heal shoes, and practice good posture.  Rest a lot with your legs elevated if you have leg cramps or low back pain.  Visit your dentist if you have not gone during your pregnancy. Use a soft toothbrush to brush your teeth and be gentle when you floss.  A sexual relationship may be continued unless your caregiver directs you otherwise.  Do not travel far distances unless it is absolutely necessary and only with the approval of your caregiver.  Take prenatal classes to understand, practice, and ask questions about the labor and delivery.  Make a trial run to the hospital.  Pack your hospital bag.  Prepare the baby's nursery.  Continue to go to all your prenatal visits as directed  by your caregiver. SEEK MEDICAL CARE IF:  You are unsure if you are in labor or if your water has broken.  You have dizziness.  You have mild pelvic cramps, pelvic pressure, or nagging pain in your abdominal area.  You have persistent nausea, vomiting, or diarrhea.  You have a bad smelling vaginal discharge.  You have pain with urination. SEEK IMMEDIATE MEDICAL CARE IF:   You have a fever.  You are leaking fluid from your vagina.  You have spotting or bleeding from your vagina.  You have severe abdominal cramping or pain.  You have rapid weight loss or gain.  You have shortness of breath with chest pain.  You notice sudden or extreme swelling of your face, hands, ankles, feet, or legs.  You have not felt your baby move in over an hour.  You have severe headaches that do not go away with medicine.  You have vision changes. Document Released: 02/15/2001 Document Revised: 10/24/2012 Document Reviewed:   04/24/2012 [employer] Patient Information 2014 [employer]. Third Trimester of Pregnancy The third trimester is from week 29 through week 42, months 7 through 9. The third trimester is a time when the fetus is growing rapidly. At the end of the ninth month, the fetus is about 20 inches in length and weighs 6 10 pounds.  BODY CHANGES Your body goes through many changes during pregnancy. The changes vary from woman to woman.   Your weight will continue to increase. You can expect to gain 25 35 pounds (11 16 kg) by the end of the pregnancy.  You may begin to get stretch marks on your hips, abdomen, and breasts.  You may urinate more often because the fetus is moving lower into your pelvis and pressing on your bladder.  You may develop or continue to have heartburn as a result of your pregnancy.  You may develop constipation because certain hormones are causing the muscles that push waste through your intestines to slow down.  You may develop hemorrhoids or swollen,  bulging veins (varicose veins).  You may have pelvic pain because of the weight gain and pregnancy hormones relaxing your joints between the bones in your pelvis. Back aches may result from over exertion of the muscles supporting your posture.  Your breasts will continue to grow and be tender. A yellow discharge may leak from your breasts called colostrum.  Your belly button may stick out.  You may feel short of breath because of your expanding uterus.  You may notice the fetus "dropping," or moving lower in your abdomen.  You may have a bloody mucus discharge. This usually occurs a few days to a week before labor begins.  Your cervix becomes thin and soft (effaced) near your due date. WHAT TO EXPECT AT YOUR PRENATAL EXAMS  You will have prenatal exams every 2 weeks until week 36. Then, you will have weekly prenatal exams. During a routine prenatal visit:  You will be weighed to make sure you and the fetus are growing normally.  Your blood pressure is taken.  Your abdomen will be measured to track your baby's growth.  The fetal heartbeat will be listened to.  Any test results from the previous visit will be discussed.  You may have a cervical check near your due date to see if you have effaced. At around 36 weeks, your caregiver will check your cervix. At the same time, your caregiver will also perform a test on the secretions of the vaginal tissue. This test is to determine if a type of bacteria, Group B streptococcus, is present. Your caregiver will explain this further. Your caregiver may ask you:  What your birth plan is.  How you are feeling.  If you are feeling the baby move.  If you have had any abnormal symptoms, such as leaking fluid, bleeding, severe headaches, or abdominal cramping.  If you have any questions. Other tests or screenings that may be performed during your third trimester include:  Blood tests that check for low iron levels (anemia).  Fetal testing  to check the health, activity level, and growth of the fetus. Testing is done if you have certain medical conditions or if there are problems during the pregnancy. FALSE LABOR You may feel small, irregular contractions that eventually go away. These are called Braxton Hicks contractions, or false labor. Contractions may last for hours, days, or even weeks before true labor sets in. If contractions come at regular intervals, intensify, or become painful, it is   best to be seen by your caregiver.  SIGNS OF LABOR   Menstrual-like cramps.  Contractions that are 5 minutes apart or less.  Contractions that start on the top of the uterus and spread down to the lower abdomen and back.  A sense of increased pelvic pressure or back pain.  A watery or bloody mucus discharge that comes from the vagina. If you have any of these signs before the 37th week of pregnancy, call your caregiver right away. You need to go to the hospital to get checked immediately. HOME CARE INSTRUCTIONS   Avoid all smoking, herbs, alcohol, and unprescribed drugs. These chemicals affect the formation and growth of the baby.  Follow your caregiver's instructions regarding medicine use. There are medicines that are either safe or unsafe to take during pregnancy.  Exercise only as directed by your caregiver. Experiencing uterine cramps is a good sign to stop exercising.  Continue to eat regular, healthy meals.  Wear a good support bra for breast tenderness.  Do not use hot tubs, steam rooms, or saunas.  Wear your seat belt at all times when driving.  Avoid raw meat, uncooked cheese, cat litter boxes, and soil used by cats. These carry germs that can cause birth defects in the baby.  Take your prenatal vitamins.  Try taking a stool softener (if your caregiver approves) if you develop constipation. Eat more high-fiber foods, such as fresh vegetables or fruit and whole grains. Drink plenty of fluids to keep your urine clear  or pale yellow.  Take warm sitz baths to soothe any pain or discomfort caused by hemorrhoids. Use hemorrhoid cream if your caregiver approves.  If you develop varicose veins, wear support hose. Elevate your feet for 15 minutes, 3 4 times a day. Limit salt in your diet.  Avoid heavy lifting, wear low heal shoes, and practice good posture.  Rest a lot with your legs elevated if you have leg cramps or low back pain.  Visit your dentist if you have not gone during your pregnancy. Use a soft toothbrush to brush your teeth and be gentle when you floss.  A sexual relationship may be continued unless your caregiver directs you otherwise.  Do not travel far distances unless it is absolutely necessary and only with the approval of your caregiver.  Take prenatal classes to understand, practice, and ask questions about the labor and delivery.  Make a trial run to the hospital.  Pack your hospital bag.  Prepare the baby's nursery.  Continue to go to all your prenatal visits as directed by your caregiver. SEEK MEDICAL CARE IF:  You are unsure if you are in labor or if your water has broken.  You have dizziness.  You have mild pelvic cramps, pelvic pressure, or nagging pain in your abdominal area.  You have persistent nausea, vomiting, or diarrhea.  You have a bad smelling vaginal discharge.  You have pain with urination. SEEK IMMEDIATE MEDICAL CARE IF:   You have a fever.  You are leaking fluid from your vagina.  You have spotting or bleeding from your vagina.  You have severe abdominal cramping or pain.  You have rapid weight loss or gain.  You have shortness of breath with chest pain.  You notice sudden or extreme swelling of your face, hands, ankles, feet, or legs.  You have not felt your baby move in over an hour.  You have severe headaches that do not go away with medicine.  You have vision changes. Document   Released: 02/15/2001 Document Revised: 10/24/2012 Document  Reviewed: 04/24/2012 [employer] Patient Information 2014 [employer].  

## 2013-04-09 ENCOUNTER — Encounter (HOSPITAL_COMMUNITY): Payer: Self-pay | Admitting: Emergency Medicine

## 2013-04-09 ENCOUNTER — Emergency Department (HOSPITAL_COMMUNITY)
Admission: EM | Admit: 2013-04-09 | Discharge: 2013-04-10 | Disposition: A | Discharge status: Home / Self Care | Payer: Medicaid Other | Attending: Emergency Medicine | Admitting: Emergency Medicine

## 2013-04-09 DIAGNOSIS — R630 Anorexia: Secondary | ICD-10-CM | POA: Insufficient documentation

## 2013-04-09 DIAGNOSIS — O9989 Other specified diseases and conditions complicating pregnancy, childbirth and the puerperium: Secondary | ICD-10-CM | POA: Insufficient documentation

## 2013-04-09 DIAGNOSIS — N898 Other specified noninflammatory disorders of vagina: Secondary | ICD-10-CM | POA: Insufficient documentation

## 2013-04-09 DIAGNOSIS — Z79899 Other long term (current) drug therapy: Secondary | ICD-10-CM | POA: Insufficient documentation

## 2013-04-09 DIAGNOSIS — J069 Acute upper respiratory infection, unspecified: Secondary | ICD-10-CM | POA: Insufficient documentation

## 2013-04-09 DIAGNOSIS — Z3490 Encounter for supervision of normal pregnancy, unspecified, unspecified trimester: Secondary | ICD-10-CM

## 2013-04-09 NOTE — ED Notes (Addendum)
Presents with one week of runny nose, mild headache, ear pain, sore throat and body aches. Pt is [redacted] weeks pregnant, denies abdominal pain, reports vaginal discharge white in color. Denies urinary symptoms. Reports inability to eat and drink due to stuffy nose and not feeling well.  Breath sounds clear.

## 2013-04-10 MED ORDER — OXYMETAZOLINE HCL 0.05 % NA SOLN
2.0000 | Freq: Once | NASAL | Status: AC
  Administered 2013-04-10: 2 via NASAL
  Filled 2013-04-10: qty 15

## 2013-04-10 NOTE — ED Provider Notes (Signed)
CSN: [id number]     Arrival date & time 04/09/13  2248 History   First MD Initiated Contact with Patient 04/10/13 0017     Chief Complaint  Patient presents with  . URI  . Vaginal Discharge   (Consider location/radiation/quality/duration/timing/severity/associated sxs/prior Treatment) HPI Pt presenting with c/o nasal congestion, frontal headache, sore throat and body aches.  She has had these symptoms ongoing for over one week.  Has taken tylenol intermittently with mild relief.  She is [redacted] week pregnant G3P1- denies abdominal pain.  No vaginal bleeding.  She states she had check up with OB/GYN last week and everything was normal.  She states she asked her OB about small amount of white discharge and was checked by OB last week.  No change in this discharge.  No dysuria.  No vomiting or change in stools.  She states she has had decreased appetite due to nasal congestion.  No fevers.  No specific sick contacts.  No recent travel.  There are no other associated systemic symptoms, there are no other alleviating or modifying factors.   Past Medical History  Diagnosis Date  . Medical history non-contributory    Past Surgical History  Procedure Laterality Date  . No past surgeries     History reviewed. No pertinent family history. History  Substance Use Topics  . Smoking status: Never Smoker   . Smokeless tobacco: Not on file  . Alcohol Use: No   OB History   Grav Para Term Preterm Abortions TAB SAB Ect Mult Living   3 1 1  1  1   1      Review of Systems ROS reviewed and all otherwise negative except for mentioned in HPI  Allergies  Review of patient's allergies indicates no known allergies.  Home Medications   Current Outpatient Rx  Name  Route  Sig  Dispense  Refill  . Doxylamine-Pyridoxine (DICLEGIS) 10-10 MG TBEC   Oral   Take 2 tablets by mouth at bedtime.         . Prenat-FeCbn-FeAspGl-FA-Omega (OB COMPLETE PETITE) 35-5-1-200 MG CAPS   Oral   Take 1 capsule by mouth  daily.   30 capsule   11    BP 101/75  Pulse 81  Temp(Src) 98.2 F (36.8 C) (Oral)  Resp 18  Ht 5\' 7"  (1.702 m)  Wt 218 lb (98.884 kg)  BMI 34.14 kg/m2  SpO2 98%  LMP 10/18/2012 Vitals reviewed Physical Exam Physical Examination: General appearance - alert, well appearing, and in no distress Mental status - alert, oriented to person, place, and time Eyes - no conjunctival injection, no scleral icterus Mouth - mucous membranes moist, pharynx normal without lesions Neck - supple, no significant adenopathy Chest - clear to auscultation, no wheezes, rales or rhonchi, symmetric air entry Heart - normal rate, regular rhythm, normal S1, S2, no murmurs, rubs, clicks or gallops Abdomen - soft, nontender, nondistended, no masses or organomegaly, gravid, uterus palpable above the level of the umbilicus Extremities - peripheral pulses normal, no pedal edema, no clubbing or cyanosis Skin - normal coloration and turgor, no rashes  ED Course  Procedures (including critical care time) Labs Review Labs Reviewed - No data to display Imaging Review No results found.  EKG Interpretation   None       MDM   1. Viral URI   2. Pregnancy    Pt is 29 week G3P2 presenting with c/o URI symptoms.  She mentioned to triage nurse having some white vaginal discharge,  but states she has been checked for this by her OB doctor last week- there has been no change and she has no abdominal pain or vaginal bleeding.  Pt given afrin for her nasal congestion, advised against using other systemic medications during pregnancy.  Pt has had symptoms for approx 1 week, doubt influenza but tamiflu would be of unlikely benefit given time course of symptoms regardless.   Patient is overall nontoxic and well hydrated in appearance.      Ethelda Chick, MD 04/10/13 [phone]

## 2013-04-10 NOTE — Discharge Instructions (Signed)
Return to the ED with any concerns including difficulty breathing, chest pain, abdominal pain, vomiting and not able to keep down liquids, vaginal bleeding, decreased level of alertness/lethargy, or any other alarming symptoms  Use the afrin nasal spray 2 sprays twice daily for no more than 3 days to help with congestion.  Check with your OB before using other medication besides tylenol as they may not be safe in pregnancy

## 2013-04-10 NOTE — ED Notes (Signed)
MD at bedside. 

## 2013-04-11 ENCOUNTER — Encounter: Admitting: Obstetrics & Gynecology

## 2013-04-11 ENCOUNTER — Ambulatory Visit (INDEPENDENT_AMBULATORY_CARE_PROVIDER_SITE_OTHER): Admitting: Obstetrics & Gynecology

## 2013-04-11 VITALS — BP 100/68 | Temp 98.7°F | Wt 220.0 lb

## 2013-04-11 DIAGNOSIS — Z3480 Encounter for supervision of other normal pregnancy, unspecified trimester: Secondary | ICD-10-CM

## 2013-04-11 DIAGNOSIS — Z20828 Contact with and (suspected) exposure to other viral communicable diseases: Secondary | ICD-10-CM

## 2013-04-11 LAB — POCT URINALYSIS DIPSTICK
Bilirubin, UA: NEGATIVE
Blood, UA: NEGATIVE
Glucose, UA: NEGATIVE
Ketones, UA: NEGATIVE
Leukocytes, UA: NEGATIVE
Nitrite, UA: NEGATIVE
Spec Grav, UA: 1.025
Urobilinogen, UA: NEGATIVE
pH, UA: 5

## 2013-04-11 MED ORDER — OSELTAMIVIR PHOSPHATE 75 MG PO CAPS: 75.0000 mg | ORAL_CAPSULE | Freq: Two times a day (BID) | ORAL | Status: AC

## 2013-04-11 NOTE — Progress Notes (Signed)
Pulse:108 Patient states she is having pelvic pressure. Patient states she has a white discharge. Patient denies any itching, irritation, burning, or odor.  Patient states she has been sick since she left last week. Patient states she is having, headaches, watery eyes, runny nose, coughing, sore throat some body aches and sometimes cold chills. Patient sates she has a loss of appetite. Patient states she was running a fever last night and took two tylenol.

## 2013-04-12 LAB — INFLUENZA A AND B
INFLUENZA A AG: NEGATIVE
Influenza B Ag: NEGATIVE

## 2013-04-13 NOTE — Patient Instructions (Signed)

## 2013-04-18 ENCOUNTER — Encounter: Admitting: Obstetrics & Gynecology

## 2013-05-03 ENCOUNTER — Ambulatory Visit (INDEPENDENT_AMBULATORY_CARE_PROVIDER_SITE_OTHER): Payer: Medicaid Other | Admitting: Obstetrics & Gynecology

## 2013-05-03 VITALS — BP 97/66 | Temp 98.3°F | Wt 218.0 lb

## 2013-05-03 DIAGNOSIS — Z3480 Encounter for supervision of other normal pregnancy, unspecified trimester: Secondary | ICD-10-CM

## 2013-05-03 LAB — POCT URINALYSIS DIPSTICK
Blood, UA: NEGATIVE
GLUCOSE UA: NEGATIVE
Leukocytes, UA: NEGATIVE
Nitrite, UA: NEGATIVE
Spec Grav, UA: 1.025
UROBILINOGEN UA: NEGATIVE
pH, UA: 5

## 2013-05-03 NOTE — Progress Notes (Signed)
Pulse 98 Pt states that she is having lot of pressure.

## 2013-05-16 ENCOUNTER — Ambulatory Visit (INDEPENDENT_AMBULATORY_CARE_PROVIDER_SITE_OTHER): Payer: Medicaid Other | Admitting: Obstetrics & Gynecology

## 2013-05-16 ENCOUNTER — Encounter: Admitting: Advanced Practice Midwife

## 2013-05-16 ENCOUNTER — Encounter: Payer: Medicaid Other | Admitting: Obstetrics & Gynecology

## 2013-05-16 VITALS — BP 111/73 | Temp 97.9°F | Wt 224.0 lb

## 2013-05-16 DIAGNOSIS — Z3480 Encounter for supervision of other normal pregnancy, unspecified trimester: Secondary | ICD-10-CM

## 2013-05-16 DIAGNOSIS — O368190 Decreased fetal movements, unspecified trimester, not applicable or unspecified: Secondary | ICD-10-CM

## 2013-05-16 LAB — POCT URINALYSIS DIPSTICK
Bilirubin, UA: NEGATIVE
Blood, UA: NEGATIVE
Glucose, UA: NEGATIVE
KETONES UA: NEGATIVE
Nitrite, UA: NEGATIVE
PH UA: 7
Protein, UA: NEGATIVE
Spec Grav, UA: 1.015
Urobilinogen, UA: NEGATIVE

## 2013-05-16 NOTE — Progress Notes (Signed)
Pulse:82 Patient states she feels the baby moving but not as much as before. Patient states she is having pelvic pressure. Patient states she is having lower back pain. Patient states she would like to know if the baby is head down yet.

## 2013-05-22 ENCOUNTER — Ambulatory Visit (INDEPENDENT_AMBULATORY_CARE_PROVIDER_SITE_OTHER): Payer: Medicaid Other | Admitting: Obstetrics

## 2013-05-22 VITALS — BP 110/70 | Temp 98.7°F | Wt 218.0 lb

## 2013-05-22 DIAGNOSIS — K219 Gastro-esophageal reflux disease without esophagitis: Secondary | ICD-10-CM

## 2013-05-22 DIAGNOSIS — O368190 Decreased fetal movements, unspecified trimester, not applicable or unspecified: Secondary | ICD-10-CM

## 2013-05-22 DIAGNOSIS — Z3480 Encounter for supervision of other normal pregnancy, unspecified trimester: Secondary | ICD-10-CM

## 2013-05-22 LAB — POCT URINALYSIS DIPSTICK
BILIRUBIN UA: NEGATIVE
Glucose, UA: NEGATIVE
Ketones, UA: NEGATIVE
Nitrite, UA: NEGATIVE
PH UA: 6.5
RBC UA: NEGATIVE
Spec Grav, UA: 1.015
Urobilinogen, UA: NEGATIVE

## 2013-05-22 MED ORDER — OMEPRAZOLE 20 MG PO CPDR: 20.0000 mg | DELAYED_RELEASE_CAPSULE | Freq: Two times a day (BID) | ORAL | Status: DC

## 2013-05-22 NOTE — Progress Notes (Signed)
Pulse 85 Pt is doing well. 

## 2013-05-23 ENCOUNTER — Encounter: Payer: Self-pay | Admitting: Obstetrics

## 2013-05-23 ENCOUNTER — Encounter: Payer: Medicaid Other | Admitting: Advanced Practice Midwife

## 2013-05-24 NOTE — Patient Instructions (Signed)
Patient information: Group B streptococcus and pregnancy (Beyond the Basics)  Authors Karen M Puopolo, MD, PhD Carol J Baker, MD Section Editors Charles J Lockwood, MD Daniel J Sexton, MD Deputy Editor Vanessa A Barss, MD Disclosures  All topics are updated as new evidence becomes available and our peer review process is complete.  Literature review current through: Feb 2014.  This topic last updated: Sep 05, 2011.  INTRODUCTION - Group B streptococcus (GBS) is a bacterium that can cause serious infections in pregnant women and newborn babies. GBS is one of many types of streptococcal bacteria, sometimes called "strep." This article discusses GBS, its effect on pregnant women and infants, and ways to prevent complications of GBS. More detailed information about GBS is available by subscription. (See "Group B streptococcal infection in pregnant women".) WHAT IS GROUP B STREP INFECTION? - GBS is commonly found in the digestive system and the vagina. In healthy adults, GBS is not harmful and does not cause problems. But in pregnant women and newborn infants, being infected with GBS can cause serious illness. Approximately one in three to four pregnant women in the [place] carries GBS in their gastrointestinal system and/or in their vagina. Carrying GBS is not the same as being infected. Carriers are not sick and do not need treatment during pregnancy. There is no treatment that can stop you from carrying GBS.  Pregnant women who are carriers of GBS infrequently become infected with GBS. GBS can cause urinary tract infections, infection of the amniotic fluid (bag of water), and infection of the uterus after delivery. GBS infections during pregnancy may lead to preterm labor.  Pregnant women who carry GBS can pass on the bacteria to their newborns, and some of those babies become infected with GBS. Newborns who are infected with GBS can develop pneumonia (lung infection), septicemia (blood infection), or  meningitis (infection of the lining of the brain and spinal cord). These complications can be prevented by giving intravenous antibiotics during labor to any woman who is at risk of GBS infection. You are at risk of GBS infection if: You have a urine culture during your current pregnancy showing GBS  You have a vaginal and rectal culture during your current pregnancy showing GBS  You had an infant infected with GBS in the past GROUP B STREP PREVENTION - Most doctors and nurses recommend a urine culture early in your pregnancy to be sure that you do not have a bladder infection without symptoms. If you urine culture shows GBS or other bacteria, you may be treated with an antibiotic. If you have symptoms of urinary infection, such as pain with urination, any time during your pregnancy, a urine culture is done. If GBS grows from the urine culture, it should be treated with an antibiotic, and you should also receive intravenous antibiotics during labor. Expert groups recommend that all pregnant women have a GBS culture at 35 to 37 weeks of pregnancy. The culture is done by swabbing the vagina and rectum. If your GBS culture is positive, you will be given an intravenous antibiotic during labor. If you have preterm labor, the culture is done then and an intravenous antibiotic is given until the baby is born or the labor is stopped by your health care provider. If you have a positive GBS culture and you have an allergy to penicillin, be sure your doctor and nurse are aware of this allergy and tell them what happened with the allergy. If you had only a rash or itching, this   is not a serious allergy, and you can receive a common drug related to the penicillin. If you had a serious allergy (for example, trouble breathing, swelling of your face) you may need an additional test to determine which antibiotic should be used during labor. Being treated with an antibiotic during labor greatly reduces the chance that you or  your newborn will develop infections related to GBS. It is important to note that young infants up to age 3 months can also develop septicemia, meningitis and other serious infections from GBS. Being treated with an antibiotic during labor does not reduce the chance that your baby will develop this later type of infection. There is currently no known way of preventing this later-onset GBS disease. WHERE TO GET MORE INFORMATION - Your healthcare provider is the best source of information for questions and concerns related to your medical problem.  

## 2013-05-30 ENCOUNTER — Ambulatory Visit (INDEPENDENT_AMBULATORY_CARE_PROVIDER_SITE_OTHER): Payer: Medicaid Other | Admitting: Obstetrics

## 2013-05-30 ENCOUNTER — Encounter: Payer: Self-pay | Admitting: Obstetrics

## 2013-05-30 VITALS — BP 111/73 | Temp 98.8°F | Wt 222.0 lb

## 2013-05-30 DIAGNOSIS — Z3480 Encounter for supervision of other normal pregnancy, unspecified trimester: Secondary | ICD-10-CM

## 2013-05-30 LAB — POCT URINALYSIS DIPSTICK
Bilirubin, UA: NEGATIVE
GLUCOSE UA: NEGATIVE
Ketones, UA: NEGATIVE
Leukocytes, UA: NEGATIVE
Nitrite, UA: NEGATIVE
PH UA: 7
SPEC GRAV UA: 1.02
UROBILINOGEN UA: NEGATIVE

## 2013-05-30 NOTE — Progress Notes (Signed)
Pulse -96 Pt states she is having lower abdomen pain.

## 2013-06-06 ENCOUNTER — Ambulatory Visit (INDEPENDENT_AMBULATORY_CARE_PROVIDER_SITE_OTHER): Payer: Medicaid Other | Admitting: Obstetrics

## 2013-06-06 VITALS — BP 110/74 | Temp 97.6°F | Wt 220.0 lb

## 2013-06-06 DIAGNOSIS — Z3480 Encounter for supervision of other normal pregnancy, unspecified trimester: Secondary | ICD-10-CM

## 2013-06-06 LAB — POCT URINALYSIS DIPSTICK
Bilirubin, UA: NEGATIVE
GLUCOSE UA: NEGATIVE
KETONES UA: NEGATIVE
Nitrite, UA: NEGATIVE
Protein, UA: NEGATIVE
RBC UA: NEGATIVE
SPEC GRAV UA: 1.01
Urobilinogen, UA: NEGATIVE
pH, UA: 6

## 2013-06-06 NOTE — Progress Notes (Signed)
Pulse:  Patient states she is having upper abdominal pain and lower abdominal pressure. Patient denies any concerns.

## 2013-06-08 LAB — STREP B DNA PROBE: GBSP: NEGATIVE

## 2013-06-10 ENCOUNTER — Encounter: Payer: Self-pay | Admitting: Obstetrics

## 2013-06-13 ENCOUNTER — Ambulatory Visit (INDEPENDENT_AMBULATORY_CARE_PROVIDER_SITE_OTHER): Payer: Medicaid Other | Admitting: Obstetrics

## 2013-06-13 VITALS — BP 135/78 | Temp 98.3°F | Wt 221.0 lb

## 2013-06-13 DIAGNOSIS — Z3480 Encounter for supervision of other normal pregnancy, unspecified trimester: Secondary | ICD-10-CM

## 2013-06-13 LAB — POCT URINALYSIS DIPSTICK
BILIRUBIN UA: NEGATIVE
Blood, UA: NEGATIVE
GLUCOSE UA: NEGATIVE
Ketones, UA: NEGATIVE
Nitrite, UA: NEGATIVE
Protein, UA: NEGATIVE
Spec Grav, UA: 1.01
Urobilinogen, UA: NEGATIVE
pH, UA: 7

## 2013-06-13 NOTE — Progress Notes (Signed)
Pulse 83 Pt states that she is having increase in lower pelvic pressure.

## 2013-06-20 ENCOUNTER — Encounter: Payer: Self-pay | Admitting: Obstetrics

## 2013-06-20 ENCOUNTER — Ambulatory Visit (INDEPENDENT_AMBULATORY_CARE_PROVIDER_SITE_OTHER): Payer: Medicaid Other | Admitting: Obstetrics

## 2013-06-20 VITALS — BP 111/68 | Temp 99.0°F | Wt 224.0 lb

## 2013-06-20 DIAGNOSIS — Z3480 Encounter for supervision of other normal pregnancy, unspecified trimester: Secondary | ICD-10-CM

## 2013-06-20 DIAGNOSIS — O368190 Decreased fetal movements, unspecified trimester, not applicable or unspecified: Secondary | ICD-10-CM

## 2013-06-20 LAB — POCT URINALYSIS DIPSTICK
BILIRUBIN UA: NEGATIVE
Blood, UA: NEGATIVE
GLUCOSE UA: NEGATIVE
KETONES UA: NEGATIVE
Leukocytes, UA: NEGATIVE
Nitrite, UA: NEGATIVE
Protein, UA: NEGATIVE
Urobilinogen, UA: NEGATIVE
pH, UA: 8

## 2013-06-20 NOTE — Progress Notes (Signed)
Subjective:    Romi Maurilio LovelyCooper NYANTI is a 28 y.o. female being seen today for her obstetrical visit. She is at 2970w4d gestation. Patient reports occasional contractions and is having alot of lower abdominl and plevic pain and pressure. Patient states she is having some body aches. . Fetal movement: decreased.  Menstrual History: OB History   Grav Para Term Preterm Abortions TAB SAB Ect Mult Living   3 1 1  1  1   1         Patient's last menstrual period was 10/18/2012.    Menstrual History: OB History   Grav Para Term Preterm Abortions TAB SAB Ect Mult Living   3 1 1  1  1   1        Patient's last menstrual period was 10/18/2012.    Past Medical History  Diagnosis Date  . Medical history non-contributory     Past Surgical History  Procedure Laterality Date  . No past surgeries       (Not in a hospital admission) No Known Allergies  History  Substance Use Topics  . Smoking status: Never Smoker   . Smokeless tobacco: Not on file  . Alcohol Use: No    No family history on file.   Review of Systems Constitutional: negative for anorexia Gastrointestinal: negative for abdominal pain Genitourinary:negative for vaginal discharge Musculoskeletal:negative for back pain Behavioral/Psych: negative for depression and tobacco use   Objective:    LMP 10/18/2012 FHT: 150 BPM  Uterine Size: size equals dates  Presentations: cephalic  Pelvic Exam:              Dilation: Closed       Effacement: Long             Station:  -3    Consistency: medium            Position: posterior     Orders Placed This Encounter  Procedures  . POCT urinalysis dipstick  . Fetal nonstress test    Assessment:    Pregnancy 39 and 4/7 weeks .  Decreased FM.  NST Reactive.    Plan:   Plans for delivery: Vaginal anticipated; labs reviewed; problem list updated Counseling: Consent signed. Infant feeding: plans to breastfeed. Cigarette smoking: never smoked. L&D discussion: symptoms of  labor, discussed when to call, discussed what number to call, anesthetic/analgesic options reviewed and delivering clinician:  plans Physician. Postpartum supports and preparation: circumcision discussed and contraception plans discussed. No orders of the defined types were placed in this encounter.   Current Outpatient Prescriptions on File Prior to Visit  Medication Sig Dispense Refill  . Doxylamine-Pyridoxine (DICLEGIS) 10-10 MG TBEC Take 2 tablets by mouth at bedtime.      Marland Kitchen. omeprazole (PRILOSEC) 20 MG capsule Take 1 capsule (20 mg total) by mouth 2 (two) times daily before a meal.  60 capsule  5  . Prenat-FeCbn-FeAspGl-FA-Omega (OB COMPLETE PETITE) 35-5-1-200 MG CAPS Take 1 capsule by mouth daily.  30 capsule  11   No current facility-administered medications on file prior to visit.   Follow up in 1 Week.

## 2013-06-21 ENCOUNTER — Encounter (HOSPITAL_COMMUNITY): Payer: Self-pay | Admitting: Family Medicine

## 2013-06-21 ENCOUNTER — Inpatient Hospital Stay (HOSPITAL_COMMUNITY)
Admission: AD | Admit: 2013-06-21 | Discharge: 2013-06-23 | DRG: 775 | Disposition: A | Discharge status: Home / Self Care | Payer: Medicaid Other | Source: Ambulatory Visit | Attending: Obstetrics | Admitting: Obstetrics

## 2013-06-21 DIAGNOSIS — IMO0001 Reserved for inherently not codable concepts without codable children: Secondary | ICD-10-CM

## 2013-06-21 DIAGNOSIS — Z3480 Encounter for supervision of other normal pregnancy, unspecified trimester: Secondary | ICD-10-CM

## 2013-06-21 HISTORY — DX: Unspecified asthma, uncomplicated: J45.909

## 2013-06-21 LAB — CBC
HCT: 37.8 % (ref 36.0–46.0)
Hemoglobin: 13.2 g/dL (ref 12.0–15.0)
MCH: 30.1 pg (ref 26.0–34.0)
MCHC: 34.9 g/dL (ref 30.0–36.0)
MCV: 86.1 fL (ref 78.0–100.0)
Platelets: 177 10*3/uL (ref 150–400)
RBC: 4.39 MIL/uL (ref 3.87–5.11)
RDW: 14.3 % (ref 11.5–15.5)
WBC: 11.5 10*3/uL — ABNORMAL HIGH (ref 4.0–10.5)

## 2013-06-21 LAB — RPR

## 2013-06-21 LAB — HIV ANTIBODY (ROUTINE TESTING W REFLEX): HIV 1&2 Ab, 4th Generation: NONREACTIVE

## 2013-06-21 MED ORDER — PRENATAL MULTIVITAMIN CH
1.0000 | ORAL_TABLET | Freq: Every day | ORAL | Status: DC
  Administered 2013-06-22 – 2013-06-23 (×2): 1 via ORAL
  Filled 2013-06-21 (×3): qty 1

## 2013-06-21 MED ORDER — DIPHENHYDRAMINE HCL 25 MG PO CAPS: 25.0000 mg | ORAL_CAPSULE | Freq: Four times a day (QID) | ORAL | Status: DC | PRN

## 2013-06-21 MED ORDER — IBUPROFEN 600 MG PO TABS
600.0000 mg | ORAL_TABLET | Freq: Four times a day (QID) | ORAL | Status: DC | PRN
  Administered 2013-06-21: 600 mg via ORAL
  Filled 2013-06-21: qty 1

## 2013-06-21 MED ORDER — OXYTOCIN 40 UNITS IN LACTATED RINGERS INFUSION - SIMPLE MED: 62.5000 mL/h | INTRAVENOUS | Status: DC

## 2013-06-21 MED ORDER — SENNOSIDES-DOCUSATE SODIUM 8.6-50 MG PO TABS
2.0000 | ORAL_TABLET | ORAL | Status: DC
  Administered 2013-06-21 – 2013-06-23 (×2): 2 via ORAL
  Filled 2013-06-21 (×2): qty 2

## 2013-06-21 MED ORDER — LACTATED RINGERS IV SOLN: 500.0000 mL | INTRAVENOUS | Status: DC | PRN

## 2013-06-21 MED ORDER — ONDANSETRON HCL 4 MG PO TABS: 4.0000 mg | ORAL_TABLET | ORAL | Status: DC | PRN

## 2013-06-21 MED ORDER — ONDANSETRON HCL 4 MG/2ML IJ SOLN: 4.0000 mg | INTRAMUSCULAR | Status: DC | PRN

## 2013-06-21 MED ORDER — LIDOCAINE HCL (PF) 1 % IJ SOLN
30.0000 mL | INTRAMUSCULAR | Status: DC | PRN
  Filled 2013-06-21: qty 30

## 2013-06-21 MED ORDER — OXYTOCIN BOLUS FROM INFUSION
500.0000 mL | INTRAVENOUS | Status: DC
Start: 2013-06-21 — End: 2013-06-21
  Administered 2013-06-21: 500 mL via INTRAVENOUS

## 2013-06-21 MED ORDER — LIDOCAINE HCL (PF) 1 % IJ SOLN
INTRAMUSCULAR | Status: AC
  Filled 2013-06-21: qty 30

## 2013-06-21 MED ORDER — CITRIC ACID-SODIUM CITRATE 334-500 MG/5ML PO SOLN: 30.0000 mL | ORAL | Status: DC | PRN

## 2013-06-21 MED ORDER — LANOLIN HYDROUS EX OINT: TOPICAL_OINTMENT | CUTANEOUS | Status: DC | PRN

## 2013-06-21 MED ORDER — OXYCODONE-ACETAMINOPHEN 5-325 MG PO TABS: 1.0000 | ORAL_TABLET | ORAL | Status: DC | PRN

## 2013-06-21 MED ORDER — ZOLPIDEM TARTRATE 5 MG PO TABS: 5.0000 mg | ORAL_TABLET | Freq: Every evening | ORAL | Status: DC | PRN

## 2013-06-21 MED ORDER — BENZOCAINE-MENTHOL 20-0.5 % EX AERO
1.0000 "application " | INHALATION_SPRAY | CUTANEOUS | Status: DC | PRN
  Administered 2013-06-21: 1 via TOPICAL
  Filled 2013-06-21: qty 56

## 2013-06-21 MED ORDER — ACETAMINOPHEN 325 MG PO TABS: 650.0000 mg | ORAL_TABLET | ORAL | Status: DC | PRN

## 2013-06-21 MED ORDER — ONDANSETRON HCL 4 MG/2ML IJ SOLN: 4.0000 mg | Freq: Four times a day (QID) | INTRAMUSCULAR | Status: DC | PRN

## 2013-06-21 MED ORDER — IBUPROFEN 600 MG PO TABS
600.0000 mg | ORAL_TABLET | Freq: Four times a day (QID) | ORAL | Status: DC
  Administered 2013-06-21 – 2013-06-23 (×6): 600 mg via ORAL
  Filled 2013-06-21 (×9): qty 1

## 2013-06-21 MED ORDER — WITCH HAZEL-GLYCERIN EX PADS: 1.0000 "application " | MEDICATED_PAD | CUTANEOUS | Status: DC | PRN

## 2013-06-21 MED ORDER — OXYTOCIN 40 UNITS IN LACTATED RINGERS INFUSION - SIMPLE MED
INTRAVENOUS | Status: AC
  Filled 2013-06-21: qty 1000

## 2013-06-21 MED ORDER — DIBUCAINE 1 % RE OINT: 1.0000 "application " | TOPICAL_OINTMENT | RECTAL | Status: DC | PRN

## 2013-06-21 MED ORDER — MEDROXYPROGESTERONE ACETATE 150 MG/ML IM SUSP: 150.0000 mg | INTRAMUSCULAR | Status: DC | PRN

## 2013-06-21 MED ORDER — OXYCODONE-ACETAMINOPHEN 5-325 MG PO TABS
1.0000 | ORAL_TABLET | ORAL | Status: DC | PRN
  Administered 2013-06-21: 2 via ORAL
  Filled 2013-06-21: qty 2

## 2013-06-21 MED ORDER — OXYTOCIN 40 UNITS IN LACTATED RINGERS INFUSION - SIMPLE MED: 62.5000 mL/h | INTRAVENOUS | Status: DC | PRN

## 2013-06-21 MED ORDER — SIMETHICONE 80 MG PO CHEW: 80.0000 mg | CHEWABLE_TABLET | ORAL | Status: DC | PRN

## 2013-06-21 MED ORDER — TETANUS-DIPHTH-ACELL PERTUSSIS 5-2.5-18.5 LF-MCG/0.5 IM SUSP: 0.5000 mL | Freq: Once | INTRAMUSCULAR | Status: DC

## 2013-06-21 MED ORDER — LACTATED RINGERS IV SOLN: INTRAVENOUS | Status: DC

## 2013-06-21 NOTE — H&P (Signed)
Hensley Excell SeltzerCooper is a 28 y.o. female presenting for UC's. Maternal Medical History:  Reason for admission: Contractions.  28 yo G3 P1.  EDC 06-23-13.  Presents with UC's.  Fetal activity: Perceived fetal activity is normal.    Prenatal complications: no prenatal complications Prenatal Complications - Diabetes: none.    OB History   Grav Para Term Preterm Abortions TAB SAB Ect Mult Living   3 1 1  1  1   1      Past Medical History  Diagnosis Date  . Asthma    Past Surgical History  Procedure Laterality Date  . No past surgeries     Family History: family history is not on file. Social History:  reports that she has never smoked. She does not have any smokeless tobacco history on file. She reports that she does not drink alcohol or use illicit drugs.   Prenatal Transfer Tool  Maternal Diabetes: No Genetic Screening: Unknown Maternal Ultrasounds/Referrals: Normal Fetal Ultrasounds or other Referrals:  None Maternal Substance Abuse:  No Significant Maternal Medications:  None Significant Maternal Lab Results:  None Other Comments:  None  Review of Systems  All other systems reviewed and are negative.     Last menstrual period 10/18/2012. Maternal Exam:  Uterine Assessment: Contraction strength is moderate.  Abdomen: Patient reports no abdominal tenderness. Fetal presentation: vertex  Introitus: Normal vulva. Normal vagina.  Pelvis: adequate for delivery.   Cervix: Cervix evaluated by digital exam.     Physical Exam  Nursing note and vitals reviewed. Constitutional: She is oriented to person, place, and time. She appears well-developed and well-nourished.  HENT:  Head: Normocephalic and atraumatic.  Eyes: Pupils are equal, round, and reactive to light.  Neck: Normal range of motion. Neck supple.  Cardiovascular: Normal rate and regular rhythm.   Respiratory: Effort normal.  GI: Soft.  Genitourinary: Vagina normal and uterus normal.  Musculoskeletal: Normal range  of motion.  Neurological: She is alert and oriented to person, place, and time.  Skin: Skin is warm and dry.  Psychiatric: She has a normal mood and affect. Her behavior is normal. Judgment and thought content normal.    Prenatal labs: ABO, Rh: AB/POS/-- (10/30 1632) Antibody: NEG (10/30 1632) Rubella: 16.20 (10/30 1632) RPR: NON REAC (12/22 1633)  HBsAg: NEGATIVE (10/30 1632)  HIV: NON REACTIVE (12/22 1633)  GBS: NEGATIVE (04/02 1823)   Assessment/Plan: 39.5 weeks.  Active labor.  Admit.   Brock Badharles A Alysen Smylie 06/21/2013, 5:03 AM

## 2013-06-21 NOTE — MAU Note (Signed)
PT ARRIVED IN LOBBY VERY UNCOMFORTABLE- BROUGHT TO RM 6 VIA W/C-  SAYS  SROM AT 0400.   DENIES HSV AND MRSA.   VE IN OFFICE - 2 CM

## 2013-06-21 NOTE — Progress Notes (Signed)
Ur chart review completed.  

## 2013-06-22 LAB — CBC
HEMATOCRIT: 37.4 % (ref 36.0–46.0)
Hemoglobin: 12.9 g/dL (ref 12.0–15.0)
MCH: 30.1 pg (ref 26.0–34.0)
MCHC: 34.5 g/dL (ref 30.0–36.0)
MCV: 87.4 fL (ref 78.0–100.0)
Platelets: 164 10*3/uL (ref 150–400)
RBC: 4.28 MIL/uL (ref 3.87–5.11)
RDW: 14.6 % (ref 11.5–15.5)
WBC: 12.2 10*3/uL — ABNORMAL HIGH (ref 4.0–10.5)

## 2013-06-22 NOTE — Lactation Note (Signed)
This note was copied from the chart of Ashley [name]. Lactation Consultation Note Follow up visit at 40 hours of age.  Mom reports breast feeding is going well and she requests a hand pump.  Discussed WIC and possible DEBP, [employer] and store for purchase.  Mom has large full breast with erect nipples.  Demonstrated hand pump with colostrum visible and mom reports discomfort encouraged mom to hand express if pump is painful now.  Baby is asleep in moms arms.  Mom to call for assist as needed.  Patient Name: Ashley [name]: 06/22/2013     Maternal Data    Feeding Feeding Type: Breast Fed  LATCH Score/Interventions Latch: Grasps breast easily, tongue down, lips flanged, rhythmical sucking.  Audible Swallowing: Spontaneous and intermittent Intervention(s): Skin to skin Intervention(s): Skin to skin  Type of Nipple: Everted at rest and after stimulation  Comfort (Breast/Nipple): Filling, red/small blisters or bruises, mild/mod discomfort  Problem noted: Mild/Moderate discomfort  Hold (Positioning): No assistance needed to correctly position infant at breast. Intervention(s): Breastfeeding basics reviewed  LATCH Score: 9  Lactation Tools Discussed/Used WIC Program: Yes Pump Review: Setup, frequency, and cleaning Initiated by:: [name] initiated:: 06/22/13   Consult Status Consult Status: Follow-up [name]: 06/23/13 Follow-up type: In-patient    Arvella MerlesJana Lynn Brent Noto 06/22/2013, 9:22 PM

## 2013-06-22 NOTE — Progress Notes (Signed)
Patient ID: Ashley DickensMitray [name], female   DOB: 08/27/1985, 28 y.o.   MRN: [id number] Postpartum day one Vital signs normal Fundus firm Lochia moderate Legs negative doing well

## 2013-06-23 NOTE — Discharge Summary (Signed)
Obstetric Discharge Summary Reason for Admission: onset of labor Prenatal Procedures: none Intrapartum Procedures: spontaneous vaginal delivery Postpartum Procedures: none Complications-Operative and Postpartum: none Hemoglobin  Date Value Ref Range Status  06/22/2013 12.9  12.0 - 15.0 g/dL Final     HCT  Date Value Ref Range Status  06/22/2013 37.4  36.0 - 46.0 % Final    Physical Exam:  General: alert Lochia: appropriate Uterine Fundus: firm Incision: healing well DVT Evaluation: No evidence of DVT seen on physical exam.  Discharge Diagnoses: Term Pregnancy-delivered  Discharge Information: Date: 06/23/2013 Activity: pelvic rest Diet: routine Medications: Percocet Condition: stable Instructions: refer to practice specific booklet Discharge to: home Follow-up Information   Follow up with [name],[name], MD In 6 weeks.   Specialty:  Obstetrics and Gynecology   Contact information:   [address] [place] [id number] [phone]       Newborn Data: Live born female  Birth Weight: 7 lb 2.1 oz (3235 g) APGAR: 8, 9  Home with mother.  Ashley CosierBernard A [name] 06/23/2013, 7:19 AM

## 2013-06-23 NOTE — Discharge Instructions (Signed)
Discharge instructions ° °· You can wash your hair °· Shower °· Eat what you want °· Drink what you want °· See me in 6 weeks °· Your ankles are going to swell more in the next 2 weeks than when pregnant °· No sex for 6 weeks ° ° °Zakiyah Diop A Demetri Goshert, MD 06/23/2013 ° ° °

## 2013-06-25 ENCOUNTER — Encounter: Payer: Self-pay | Admitting: Obstetrics

## 2013-06-27 ENCOUNTER — Encounter: Payer: Medicaid Other | Admitting: Obstetrics

## 2013-08-28 ENCOUNTER — Encounter: Payer: Self-pay | Admitting: Obstetrics

## 2013-08-28 ENCOUNTER — Ambulatory Visit (INDEPENDENT_AMBULATORY_CARE_PROVIDER_SITE_OTHER): Payer: Medicaid Other | Admitting: Obstetrics

## 2013-08-28 DIAGNOSIS — Z3009 Encounter for other general counseling and advice on contraception: Secondary | ICD-10-CM

## 2013-08-28 DIAGNOSIS — Z3202 Encounter for pregnancy test, result negative: Secondary | ICD-10-CM

## 2013-08-28 LAB — POCT URINE PREGNANCY: Preg Test, Ur: NEGATIVE

## 2013-08-28 NOTE — Progress Notes (Signed)
Subjective:     Ashley [name] is a 28 y.o. female who presents for a postpartum visit. She is 6 weeks postpartum following a spontaneous vaginal delivery. I have fully reviewed the prenatal and intrapartum course. The delivery was at 39 gestational weeks. Outcome: spontaneous vaginal delivery. Anesthesia: epidural. Postpartum course has been normal. Baby's course has been normal. Baby is feeding by breast. Bleeding no bleeding. Bowel function is normal. Bladder function is normal. Patient is sexually active. Contraception method is none. Postpartum depression screening: negative.  The following portions of the patient's history were reviewed and updated as appropriate: allergies, current medications, past family history, past medical history, past social history, past surgical history and problem list.  Review of Systems A comprehensive review of systems was negative.   Objective:    BP 113/79  Pulse 72  Temp(Src) 98.1 F (36.7 C)  Ht 5\' 7"  (1.702 m)  Wt 201 lb (91.173 kg)  BMI 31.47 kg/m2  Breastfeeding? Yes  General:  alert and no distress   Breasts:  inspection negative, no nipple discharge or bleeding, no masses or nodularity palpable  Lungs: clear  Heart:  regular rate and rhythm, S1, S2 normal, no murmur, click, rub or gallop  Abdomen: normal findings: soft, non-tender   Vulva:  normal  Vagina: normal vagina  Cervix:  no lesions  Corpus: normal size, contour, position, consistency, mobility, non-tender  Adnexa:  no mass, fullness, tenderness  Rectal Exam: Not performed.           Assessment:     Normal postpartum exam. Pap smear not done at today's visit.   Plan:    1. Contraception: Nexplanon 2. Nexplanon 3. Follow up in: 2 weeks or as needed.

## 2013-09-11 ENCOUNTER — Ambulatory Visit: Payer: Self-pay | Admitting: Obstetrics

## 2013-12-17 ENCOUNTER — Encounter (HOSPITAL_COMMUNITY): Payer: Self-pay | Admitting: Emergency Medicine

## 2013-12-17 DIAGNOSIS — R51 Headache: Secondary | ICD-10-CM | POA: Insufficient documentation

## 2013-12-17 DIAGNOSIS — N3000 Acute cystitis without hematuria: Secondary | ICD-10-CM | POA: Insufficient documentation

## 2013-12-17 DIAGNOSIS — Z3202 Encounter for pregnancy test, result negative: Secondary | ICD-10-CM | POA: Insufficient documentation

## 2013-12-17 DIAGNOSIS — J45909 Unspecified asthma, uncomplicated: Secondary | ICD-10-CM | POA: Insufficient documentation

## 2013-12-17 NOTE — ED Notes (Signed)
Patient arrives with complaint of head pain and abdominal pain. States that the head pain started about a year ago and has been intermittent. The head pain additionally radiates to right side of neck and right shoulder when it is present. Explains that yesterday morning when she woke she noticed the abdominal pain. Describes the pain as a cramping pressure, it is worsened with palpation, and is located in the RUQ and RLQ. Denies fever. Endorses nausea.

## 2013-12-18 ENCOUNTER — Emergency Department (HOSPITAL_COMMUNITY)
Admission: EM | Admit: 2013-12-18 | Discharge: 2013-12-18 | Disposition: A | Discharge status: Home / Self Care | Payer: Medicaid Other | Attending: Emergency Medicine | Admitting: Emergency Medicine

## 2013-12-18 DIAGNOSIS — N3000 Acute cystitis without hematuria: Secondary | ICD-10-CM

## 2013-12-18 LAB — URINALYSIS, ROUTINE W REFLEX MICROSCOPIC
BILIRUBIN URINE: NEGATIVE
GLUCOSE, UA: NEGATIVE mg/dL
HGB URINE DIPSTICK: NEGATIVE
KETONES UR: NEGATIVE mg/dL
Nitrite: NEGATIVE
PH: 6 (ref 5.0–8.0)
Protein, ur: NEGATIVE mg/dL
Specific Gravity, Urine: 1.018 (ref 1.005–1.030)
Urobilinogen, UA: 0.2 mg/dL (ref 0.0–1.0)

## 2013-12-18 LAB — CBC WITH DIFFERENTIAL/PLATELET
BASOS ABS: 0 10*3/uL (ref 0.0–0.1)
Basophils Relative: 0 % (ref 0–1)
Eosinophils Absolute: 0.7 10*3/uL (ref 0.0–0.7)
Eosinophils Relative: 9 % — ABNORMAL HIGH (ref 0–5)
HCT: 40.1 % (ref 36.0–46.0)
Hemoglobin: 13.6 g/dL (ref 12.0–15.0)
LYMPHS ABS: 3.7 10*3/uL (ref 0.7–4.0)
LYMPHS PCT: 43 % (ref 12–46)
MCH: 29.2 pg (ref 26.0–34.0)
MCHC: 33.9 g/dL (ref 30.0–36.0)
MCV: 86.2 fL (ref 78.0–100.0)
MONO ABS: 0.6 10*3/uL (ref 0.1–1.0)
Monocytes Relative: 7 % (ref 3–12)
NEUTROS ABS: 3.4 10*3/uL (ref 1.7–7.7)
Neutrophils Relative %: 41 % — ABNORMAL LOW (ref 43–77)
Platelets: 251 10*3/uL (ref 150–400)
RBC: 4.65 MIL/uL (ref 3.87–5.11)
RDW: 12.8 % (ref 11.5–15.5)
WBC: 8.4 10*3/uL (ref 4.0–10.5)

## 2013-12-18 LAB — URINE MICROSCOPIC-ADD ON

## 2013-12-18 LAB — COMPREHENSIVE METABOLIC PANEL
ALT: 16 U/L (ref 0–35)
AST: 18 U/L (ref 0–37)
Albumin: 3.7 g/dL (ref 3.5–5.2)
Alkaline Phosphatase: 77 U/L (ref 39–117)
Anion gap: 12 (ref 5–15)
BILIRUBIN TOTAL: 0.5 mg/dL (ref 0.3–1.2)
BUN: 11 mg/dL (ref 6–23)
CHLORIDE: 106 meq/L (ref 96–112)
CO2: 26 meq/L (ref 19–32)
Calcium: 9 mg/dL (ref 8.4–10.5)
Creatinine, Ser: 0.98 mg/dL (ref 0.50–1.10)
GFR, EST NON AFRICAN AMERICAN: 78 mL/min — AB (ref >90)
GLUCOSE: 93 mg/dL (ref 70–99)
Potassium: 3.5 mEq/L — ABNORMAL LOW (ref 3.7–5.3)
Sodium: 144 mEq/L (ref 137–147)
Total Protein: 7.5 g/dL (ref 6.0–8.3)

## 2013-12-18 LAB — PREGNANCY, URINE: Preg Test, Ur: NEGATIVE

## 2013-12-18 LAB — LIPASE, BLOOD: Lipase: 32 U/L (ref 11–59)

## 2013-12-18 MED ORDER — CEPHALEXIN 500 MG PO CAPS: 500.0000 mg | ORAL_CAPSULE | Freq: Two times a day (BID) | ORAL | Status: DC

## 2013-12-18 MED ORDER — CEPHALEXIN 250 MG PO CAPS
500.0000 mg | ORAL_CAPSULE | Freq: Once | ORAL | Status: AC
  Administered 2013-12-18: 500 mg via ORAL
  Filled 2013-12-18: qty 2

## 2013-12-18 NOTE — Discharge Instructions (Signed)
Urinary Tract Infection Ashley [name], you were seen today for abdominal pain. Your urine shows an infection. Take antibiotics as prescribed. Your urine did not show any infection. Followup with a primary care doctor For continued care of within 3 days. If any symptoms worsen come back to emergency department for repeat evaluation. A urinary tract infection (UTI) can occur any place along the urinary tract. The tract includes the kidneys, ureters, bladder, and urethra. A type of germ called bacteria often causes a UTI. UTIs are often helped with antibiotic medicine.  HOME CARE   If given, take antibiotics as told by your doctor. Finish them even if you start to feel better.  Drink enough fluids to keep your pee (urine) clear or pale yellow.  Avoid tea, drinks with caffeine, and bubbly (carbonated) drinks.  Pee often. Avoid holding your pee in for a long time.  Pee before and after having sex (intercourse).  Wipe from front to back after you poop (bowel movement) if you are a woman. Use each tissue only once. GET HELP RIGHT AWAY IF:   You have back pain.  You have lower belly (abdominal) pain.  You have chills.  You feel sick to your stomach (nauseous).  You throw up (vomit).  Your burning or discomfort with peeing does not go away.  You have a fever.  Your symptoms are not better in 3 days. MAKE SURE YOU:   Understand these instructions.  Will watch your condition.  Will get help right away if you are not doing well or get worse. Document Released: 08/10/2007 Document Revised: 11/16/2011 Document Reviewed: 09/22/2011 [facility] Patient Information 2015 [place], [place]. This information is not intended to replace advice given to you by your health care provider. Make sure you discuss any questions you have with your health care provider. Otalgia The most common reason for this in children is an infection of the middle ear. Pain from the middle ear is usually caused by a build-up  of fluid and pressure behind the eardrum. Pain from an earache can be sharp, dull, or burning. The pain may be temporary or constant. The middle ear is connected to the nasal passages by a short narrow tube called the Eustachian tube. The Eustachian tube allows fluid to drain out of the middle ear, and helps keep the pressure in your ear equalized. CAUSES  A cold or allergy can block the Eustachian tube with inflammation and the build-up of secretions. This is especially likely in small children, because their Eustachian tube is shorter and more horizontal. When the Eustachian tube closes, the normal flow of fluid from the middle ear is stopped. Fluid can accumulate and cause stuffiness, pain, hearing loss, and an ear infection if germs start growing in this area. SYMPTOMS  The symptoms of an ear infection may include fever, ear pain, fussiness, increased crying, and irritability. Many children will have temporary and minor hearing loss during and right after an ear infection. Permanent hearing loss is rare, but the risk increases the more infections a child has. Other causes of ear pain include retained water in the outer ear canal from swimming and bathing. Ear pain in adults is less likely to be from an ear infection. Ear pain may be referred from other locations. Referred pain may be from the joint between your jaw and the skull. It may also come from a tooth problem or problems in the neck. Other causes of ear pain include:  A foreign body in the ear.  Outer  ear infection.  Sinus infections.  Impacted ear wax.  Ear injury.  Arthritis of the jaw or TMJ problems.  Middle ear infection.  Tooth infections.  Sore throat with pain to the ears. DIAGNOSIS  Your caregiver can usually make the diagnosis by examining you. Sometimes other special studies, including x-rays and lab work may be necessary. TREATMENT   If antibiotics were prescribed, use them as directed and finish them even if you  or your child's symptoms seem to be improved.  Sometimes PE tubes are needed in children. These are little plastic tubes which are put into the eardrum during a simple surgical procedure. They allow fluid to drain easier and allow the pressure in the middle ear to equalize. This helps relieve the ear pain caused by pressure changes. HOME CARE INSTRUCTIONS   Only take over-the-counter or prescription medicines for pain, discomfort, or fever as directed by your caregiver. DO NOT GIVE CHILDREN ASPIRIN because of the association of Reye's Syndrome in children taking aspirin.  Use a cold pack applied to the outer ear for 15-20 minutes, 03-04 times per day or as needed may reduce pain. Do not apply ice directly to the skin. You may cause frost bite.  Over-the-counter ear drops used as directed may be effective. Your caregiver may sometimes prescribe ear drops.  Resting in an upright position may help reduce pressure in the middle ear and relieve pain.  Ear pain caused by rapidly descending from high altitudes can be relieved by swallowing or chewing gum. Allowing infants to suck on a bottle during airplane travel can help.  Do not smoke in the house or near children. If you are unable to quit smoking, smoke outside.  Control allergies. SEEK IMMEDIATE MEDICAL CARE IF:   You or your child are becoming sicker.  Pain or fever relief is not obtained with medicine.  You or your child's symptoms (pain, fever, or irritability) do not improve within 24 to 48 hours or as instructed.  Severe pain suddenly stops hurting. This may indicate a ruptured eardrum.  You or your children develop new problems such as severe headaches, stiff neck, difficulty swallowing, or swelling of the face or around the ear. Document Released: 10/09/2003 Document Revised: 05/16/2011 Document Reviewed: 02/13/2008 [facility] Patient Information 2015 [place], [place]. This information is not intended to replace advice given to you  by your health care provider. Make sure you discuss any questions you have with your health care provider.  Emergency Department Resource Guide 1) Find a Doctor and Pay Out of Pocket Although you won't have to find out who is covered by your insurance plan, it is a good idea to ask around and get recommendations. You will then need to call the office and see if the doctor you have chosen will accept you as a new patient and what types of options they offer for patients who are self-pay. Some doctors offer discounts or will set up payment plans for their patients who do not have insurance, but you will need to ask so you aren't surprised when you get to your appointment.  2) Contact Your Local Health Department Not all health departments have doctors that can see patients for sick visits, but many do, so it is worth a call to see if yours does. If you don't know where your local health department is, you can check in your phone book. The CDC also has a tool to help you locate your state's health department, and many state websites also have  listings of all of their local health departments.  3) Find a Walk-in Clinic If your illness is not likely to be very severe or complicated, you may want to try a walk in clinic. These are popping up all over the country in pharmacies, drugstores, and shopping centers. They're usually staffed by nurse practitioners or physician assistants that have been trained to treat common illnesses and complaints. They're usually fairly quick and inexpensive. However, if you have serious medical issues or chronic medical problems, these are probably not your best option.  No Primary Care Doctor: - Call Health Connect at  [phone] - they can help you locate a primary care doctor that  accepts your insurance, provides certain services, etc. - Physician Referral Service- [phone]  Chronic Pain Problems: Organization         Address  Phone   Notes  [facility]  [phone] Patients need to be referred by their primary care doctor.   Medication Assistance: Organization         Address  Phone   Notes  [facility] Medication [facility] [address] [place], [place] [address] [phone] --Must be a resident of [facility] -- Must have NO insurance coverage whatsoever (no Medicaid/ Medicare, etc.) -- The pt. MUST have a primary care doctor that directs their care regularly and follows them in the community   [facility]  [phone]   [employer]  [phone]    Agencies that provide inexpensive medical care: Organization         Address  Phone   Notes  [facility]  [phone]   [facility]    [phone]   [facility] [address] [place], [place] [address] [phone]   [facility] [address] [place] [place] [phone]   Planned Parenthood    [phone]   [facility]    [phone]   [facility]  [address], [place] Phone:  [phone], Fax:  [phone] Hours of Operation:  9 am - 6 pm, M-F.  Also accepts Medicaid/Medicare and self-pay.  [facility]  [address], [place] Phone: [phone], Fax: [phone]. Hours of Operation:  8:30 am - 5:30 pm, M-F.  Also accepts Medicaid and self-pay.  [facility] [place] [address], [place] Phone: [phone]   [facility] [address] Natasha Bence [place], [place] [phone], Ext. [phone] Mondays & Thursdays: 7-9 AM.  First 15 patients are seen on a first come, first serve basis.    Medicaid-accepting [facility] Providers:  Organization         Address  Phone   Notes  [facility] [address], [place] [phone] Also accepts self-pay patients.  [facility] [address] Laurell Josephs [place],  [place]  [phone]   [facility] [address], [place] [phone]   [facility] [address] [place] [phone]   Renaye Rakers [address], [place]   [phone] Only accepts [place] Access [place] patients after they have their name applied to their card.   Self-Pay (no insurance) in [facility]:  [employer]  Notes  Sickle Cell Patients, [facility] [address] [place], [place] [phone]   [facility] [address] [place], [place] [phone]   [facility] [place]  [address], [place] [phone]   [facility]/Dr. Osei-Bonsu  [address], [place] or [address], [place] [phone] Phone number for both [place] and [place] locations is the same.  [facility] [address] [place] [phone]   [facility] [address] [place] or [address] [phone]   [facility] [address] [place] [phone], phone; [phone], fax Sees patients 1st and 3rd Saturday of every month.  Must not qualify for public or private insurance (i.e. Medicaid, Medicare, [place] Health Choice, Veterans' Benefits)  Household income should be no more than 200% of the poverty level The clinic cannot treat you if you are pregnant or think you are pregnant  Sexually transmitted diseases are not treated at the clinic.    Dental Care: Organization         Address  Phone  Notes  [facility] of [facility] [address] [place], [place] [phone] Accepts children up to age 4 who are enrolled in [place] or [place] Health Choice; pregnant women with a Medicaid card; and children who have applied for Medicaid or [place] Health  Choice, but were declined, whose parents can pay a reduced fee at time of service.  [facility] Department of [facility]  [address], [place] [phone] Accepts children up to age 32 who are enrolled in [place] or [place] Health Choice; pregnant women with a Medicaid card; and children who have applied for Medicaid or [place] Health Choice, but were declined, whose parents can pay a reduced fee at time of service.  [facility]  [address] [place], [place] [phone] Patients are seen by appointment only. Walk-ins are not accepted. [facility] will see patients 25 years of age and older. Monday - Tuesday (8am-5pm) Most Wednesdays (8:30-5pm) $30 per visit, cash only  [facility] Adult [facility] Access [facility]  [address], [facility] [phone] Patients are seen by appointment only. Walk-ins are not accepted. [facility] will see patients 33 years of age and older. One Wednesday Evening (Monthly: Volunteer Based).  $30 per visit, cash only  [employer]  [phone] for adults; Children under age 70, call Graduate Pediatric Dentistry at [phone]. Children aged 31-14, please call [phone] to request a pediatric application.  Dental services are provided in all areas of dental care including fillings, crowns and bridges, complete and partial dentures, implants, gum treatment, root canals, and extractions. Preventive care is also provided. Treatment is provided to both adults and children. Patients are selected via a lottery and there is often a waiting list.   [facility] [address] [place]  [phone] [email or web address]   [facility] [address] [place], [place] [phone], Ext. [phone] Second and Fourth Thursday of each month, opens at 6:30 AM; Clinic ends at 9 AM.  Patients are seen on a first-come first-served basis, and a limited number are seen during each  clinic.   [facility]  [address] Ether Griffins [place], [place] [phone]  Eligibility Requirements You must have lived in [place], [place], or [place] counties for at least the last three months.   You cannot be eligible for state or federal sponsored [place], including [employer], [place], or [employer].   You generally cannot be eligible for healthcare insurance through your employer.    How to apply: Eligibility screenings are held every Tuesday and Wednesday afternoon from 1:00 pm until 4:00 pm. You do not need an appointment for the interview!  [facility] [address], [place], [place] [phone]   [facility] Health Department  [phone]   [facility] Health Department  [phone]   [facility] Health Department  [phone]    Behavioral Health Resources in the Community: Intensive Outpatient Programs Organization         Address  Phone  Notes  [facility] [address] [place], [place] [phone]   [facility] [address], [place], [place] [phone]   [facility] [address] [place], [place]  [phone]   [facility] [address],  [place], [place] [phone] or [phone]   Substance Abuse Resources Organization         Address  Phone  Notes  [facility]  [phone]   [facility]  [phone]   [facility]  [phone]   Floydene Flock  [phone]   Residential & Outpatient Substance Abuse Program  [phone]   Psychological Services Organization         Address  Phone  Notes  [facility]  [phone]   [facility] Services  [phone]   [facility] [address] [place] [phone] or [phone]    Mobile Crisis Teams Organization         Address  Phone  Notes  [facility], Mobile  Crisis Care Unit  [phone]   [facility]  [address] [place], [place] [phone]   Doristine Locks [address] [place] [phone]    Self-Help/Support Groups Organization         Address  Phone             Notes  [facility] [place] - variety of support groups  336- I7437963 Call for more information  Narcotics Anonymous (NA), [facility] [address], [employer] [place]  2 meetings at this location   Statistician         Address  Phone  Notes  [facility] [address],    [place]  [phone]   [facility]  [address] [place] [address], [place], [place] [phone]   [facility] [address] [place], [place] [phone] Admissions: 8am-3pm M-F  [facility] [address]    [place], [place] [phone]   [facility] [address] Starling Manns [place], [place] [phone]   [facility] [address],  [place], [place] [phone]   [facility] - Intensive Outpatient [address] Laurell Josephs [address], [place], [place] [phone]   [facility] (Addiction Recovery [facility] [address] [place].,  [place], [place] [phone] or [phone]   [facility] ([facility]) [address], [place], [place] [phone] Accepts Medicaid  [facility] [address]  [place] [phone] Substance Abuse/Addiction Treatment   [facility] Resources Organization  Address  Phone  Notes  [facility]  [phone]) [phone]   Angie FavaJulie Brannon, PhD [address] [place], [place]   [phone]) [phone] or [phone]) [phone]   [facility]   [address] [place], [place] [phone](336) [phone]   [facility] [address], [place], [place] [phone](336) [phone] Insurance/Medicaid/sponsorship through [facility]  [facility] [address]                                    [place], [place] [phone](336) [phone] Therapy/tele-psych/case  [facility] [address]   [place], [place] [phone](336) [phone]    Dr. Lolly MustacheArfeen  [phone]) [phone]   [facility] [place]  [employer] [facility] 1) [address], [place] 2) [address], [place] 3)  [address], [place] [phone]) [phone]) [phone]  [phone](336) [phone]   [facility] Child Abuse Hotline [phone](336) [phone] or [phone](336) [phone] (After Hours)

## 2013-12-18 NOTE — ED Provider Notes (Signed)
CSN: [id number]     Arrival date & time 12/17/13  2343 History   First MD Initiated Contact with Patient 12/18/13 0132     Chief Complaint  Patient presents with  . Abdominal Pain  . Headache     (Consider location/radiation/quality/duration/timing/severity/associated sxs/prior Treatment) HPI Ashley [name] is a 28 y.o. female with Past medical history of asthma coming in with abdominal pain. Patient states this. Umbilical has been going on for 3 days. It feels like cramping or burning. It is intermittent. It is worse when she bends over. She denies fevers or sweating. Nothing has made it better. She recently had a vaginal delivery 5 months ago which was normal. She denies dysuria hematuria vaginal bleeding, vaginal discharge. Her last menstrual cycle ended last week and was slightly heavier than normal. Patient also missed 2 a headache going on for the past year. It is right-sided it causes right ear pain as well. She states that the pain does resolve with Tylenol. She feels as if fluid in her ear. She denies chest pain shortness of breath. Patient has no further complaints.  10 Systems reviewed and are negative for acute change except as noted in the HPI.     Past Medical History  Diagnosis Date  . Asthma    Past Surgical History  Procedure Laterality Date  . No past surgeries     History reviewed. No pertinent family history. History  Substance Use Topics  . Smoking status: Never Smoker   . Smokeless tobacco: Not on file  . Alcohol Use: No   OB History   Grav Para Term Preterm Abortions TAB SAB Ect Mult Living   3 2 2  1  1   2      Review of Systems    Allergies  Review of patient's allergies indicates no known allergies.  Home Medications   Prior to Admission medications   Medication Sig Start Date End Date Taking? Authorizing Provider  Prenatal Vit-Fe Fumarate-FA (PRENATAL MULTIVITAMIN) TABS tablet Take 1 tablet by mouth daily at 12 noon.   Yes Historical  Provider, MD   BP 120/80  Pulse 67  Temp(Src) 98.4 F (36.9 C) (Oral)  Resp 18  Ht 5\' 7"  (1.702 m)  Wt 282 lb 2 oz (127.971 kg)  BMI 44.18 kg/m2  SpO2 100%  LMP 12/07/2013  Breastfeeding? Yes Physical Exam  Nursing note and vitals reviewed. Constitutional: She is oriented to person, place, and time. She appears well-developed and well-nourished. No distress.  HENT:  Head: Normocephalic and atraumatic.  Nose: Nose normal.  Mouth/Throat: Oropharynx is clear and moist. No oropharyngeal exudate.  Bilateral TMs visualized, they are pearly gray with a light reflex. There is no fluid or purulence behind the TM.  Eyes: Conjunctivae and EOM are normal. Pupils are equal, round, and reactive to light. No scleral icterus.  Neck: Normal range of motion. Neck supple. No JVD present. No tracheal deviation present. No thyromegaly present.  Cardiovascular: Normal rate, regular rhythm and normal heart sounds.  Exam reveals no gallop and no friction rub.   No murmur heard. Pulmonary/Chest: Effort normal and breath sounds normal. No respiratory distress. She has no wheezes. She exhibits no tenderness.  Abdominal: Soft. Bowel sounds are normal. She exhibits mass. She exhibits no distension. There is tenderness. There is no rebound and no guarding.  Periumbilical tenderness to palpation. Small hernia palpated easily reducible. Suprapubic tenderness to palpation.  Musculoskeletal: Normal range of motion. She exhibits no edema and no tenderness.  Lymphadenopathy:  She has no cervical adenopathy.  Neurological: She is alert and oriented to person, place, and time. No cranial nerve deficit. She exhibits normal muscle tone.  Skin: Skin is warm and dry. No rash noted. She is not diaphoretic. No erythema. No pallor.    ED Course  Procedures (including critical care time) Labs Review Labs Reviewed  CBC WITH DIFFERENTIAL - Abnormal; Notable for the following:    Neutrophils Relative % 41 (*)     Eosinophils Relative 9 (*)    All other components within normal limits  COMPREHENSIVE METABOLIC PANEL - Abnormal; Notable for the following:    Potassium 3.5 (*)    GFR calc non Af Amer 78 (*)    All other components within normal limits  URINALYSIS, ROUTINE W REFLEX MICROSCOPIC - Abnormal; Notable for the following:    Leukocytes, UA SMALL (*)    All other components within normal limits  URINE MICROSCOPIC-ADD ON - Abnormal; Notable for the following:    Bacteria, UA FEW (*)    All other components within normal limits  LIPASE, BLOOD  PREGNANCY, URINE    Imaging Review No results found.   EKG Interpretation None      MDM   Final diagnoses:  None    Patient presents emergency department out of concern for abdominal pain. Urinalysis shows perhaps a small UTI. Said states the pain radiates to her right flank. In the setting of her recent delivery patient will be  Treated for urinary tract infection. She was given Keflex emergency department and will be discharged with the same. She denies food making her symptoms better or worse. Her periumbilical pain may be due to the hernia palpated. She began surgery followup. Her vital signs remain within normal limits and she is safe for discharge.    [name], MD 12/18/13 [phone]

## 2014-01-06 ENCOUNTER — Encounter (HOSPITAL_COMMUNITY): Payer: Self-pay | Admitting: Emergency Medicine

## 2014-03-03 ENCOUNTER — Encounter: Payer: Self-pay | Admitting: *Deleted

## 2014-03-04 ENCOUNTER — Encounter: Payer: Self-pay | Admitting: Obstetrics & Gynecology

## 2014-09-29 ENCOUNTER — Emergency Department (INDEPENDENT_AMBULATORY_CARE_PROVIDER_SITE_OTHER)
Admission: EM | Admit: 2014-09-29 | Discharge: 2014-09-29 | Disposition: A | Discharge status: Home / Self Care | Payer: Self-pay | Source: Home / Self Care | Attending: Emergency Medicine | Admitting: Emergency Medicine

## 2014-09-29 ENCOUNTER — Encounter (HOSPITAL_COMMUNITY): Payer: Self-pay | Admitting: Emergency Medicine

## 2014-09-29 DIAGNOSIS — M5441 Lumbago with sciatica, right side: Secondary | ICD-10-CM

## 2014-09-29 MED ORDER — MELOXICAM 15 MG PO TABS: 15.0000 mg | ORAL_TABLET | Freq: Every day | ORAL | Status: DC

## 2014-09-29 MED ORDER — PREDNISONE 50 MG PO TABS: ORAL_TABLET | ORAL | Status: DC

## 2014-09-29 MED ORDER — CYCLOBENZAPRINE HCL 10 MG PO TABS
10.0000 mg | ORAL_TABLET | Freq: Every day | ORAL | Status: DC
Start: 2014-09-29 — End: 2015-06-30

## 2014-09-29 NOTE — ED Notes (Signed)
Pt states that she has been having right sided hip pain for a few months. Pt denies any injury or fall.

## 2014-09-29 NOTE — ED Provider Notes (Signed)
CSN: [id number]     Arrival date & time 09/29/14  1428 History   First MD Initiated Contact with Patient 09/29/14 1657     No chief complaint on file. CC: back pain  (Consider location/radiation/quality/duration/timing/severity/associated sxs/prior Treatment) HPI  She is a 29 year old woman here for evaluation of back pain. She states for the last month she has had pain in her right lower back and hip. It goes down into her right thigh. It is worse when laying down and standing up. She feels it when she is walking, but it is not as bad. The pain is described as burning at times. She denies any weakness in the leg.  No bowel or bladder incontinence. This has gradually been getting worse over the last month. She has tried Tylenol and Aleve without improvement. She has also been stretching without improvement. She also states that in the last week or so the pain has been going up her back and into her right occiput.  She denies any injury or trauma.  Past Medical History  Diagnosis Date  . Asthma    Past Surgical History  Procedure Laterality Date  . No past surgeries     No family history on file. History  Substance Use Topics  . Smoking status: Never Smoker   . Smokeless tobacco: Not on file  . Alcohol Use: No   OB History    Gravida Para Term Preterm AB TAB SAB Ectopic Multiple Living   Review of Systems As in history of present illness Allergies  Review of patient's allergies indicates no known allergies.  Home Medications   Prior to Admission medications   Medication Sig Start Date End Date Taking? Authorizing Provider  cephALEXin (KEFLEX) 500 MG capsule Take 1 capsule (500 mg total) by mouth 2 (two) times daily. 12/18/13   Tomasita Crumble, MD  cyclobenzaprine (FLEXERIL) 10 MG tablet Take 1 tablet (10 mg total) by mouth at bedtime. For 1 week, then as needed 09/29/14   Charm Rings, MD  meloxicam (MOBIC) 15 MG tablet Take 1 tablet (15 mg total) by mouth  daily. For 1 week, then as needed. 09/29/14   Charm Rings, MD  predniSONE (DELTASONE) 50 MG tablet Take 1 pill daily for 5 days. 09/29/14   Charm Rings, MD  Prenatal Vit-Fe Fumarate-FA (PRENATAL MULTIVITAMIN) TABS tablet Take 1 tablet by mouth daily at 12 noon.    Historical Provider, MD   BP 104/69 mmHg  Pulse 79  Temp(Src) 98.2 F (36.8 C) (Oral)  Resp 16  SpO2 100% Physical Exam  Constitutional: She is oriented to person, place, and time. She appears well-developed and well-nourished. No distress.  Neck: Neck supple.  Cardiovascular: Normal rate.   Pulmonary/Chest: Effort normal.  Musculoskeletal:  Back: No erythema or edema. No vertebral tenderness or step-offs. She is tender in the right SI and piriformis areas. 5 out of 5 strength in bilateral lower extremities. Straight leg raise is positive on the right. Straight leg raise on the left reproduces pain on the right.  Neurological: She is alert and oriented to person, place, and time.    ED Course  Procedures (including critical care time) Labs Review Labs Reviewed - No data to display  Imaging Review No results found.   MDM   1. Right-sided low back pain with right-sided sciatica    Conservative management with ice massage, prednisone, meloxicam, Flexeril. If no improvement  in 1-2 weeks, please follow up with orthopedics.   Charm Rings, MD 09/29/14 [phone]

## 2014-09-29 NOTE — Discharge Instructions (Signed)
You have musculoskeletal back pain with sciatica. Take prednisone daily for the next 5 days. Take Flexeril at bedtime for the next week. This medicine will make you drowsy. Take meloxicam daily for the next week. Do not take this with ibuprofen, Motrin, Advil, or Aleve. Put a golf ball or a tennis ball in the freezer. Do ice massage in the area of tenderness in your right buttock. Continue your stretching. You should see improvement over the next 1-2 weeks. If you are not getting better, please follow-up with the orthopedic doctor.

## 2014-11-26 ENCOUNTER — Encounter (HOSPITAL_COMMUNITY): Payer: Self-pay | Admitting: Family Medicine

## 2014-11-26 ENCOUNTER — Emergency Department (HOSPITAL_COMMUNITY)
Admission: EM | Admit: 2014-11-26 | Discharge: 2014-11-26 | Disposition: A | Discharge status: Home / Self Care | Payer: Medicaid Other | Attending: Emergency Medicine | Admitting: Emergency Medicine

## 2014-11-26 DIAGNOSIS — S39012A Strain of muscle, fascia and tendon of lower back, initial encounter: Secondary | ICD-10-CM | POA: Diagnosis not present

## 2014-11-26 DIAGNOSIS — Y9389 Activity, other specified: Secondary | ICD-10-CM | POA: Diagnosis not present

## 2014-11-26 DIAGNOSIS — Y998 Other external cause status: Secondary | ICD-10-CM | POA: Insufficient documentation

## 2014-11-26 DIAGNOSIS — X58XXXA Exposure to other specified factors, initial encounter: Secondary | ICD-10-CM | POA: Diagnosis not present

## 2014-11-26 DIAGNOSIS — Y9289 Other specified places as the place of occurrence of the external cause: Secondary | ICD-10-CM | POA: Diagnosis not present

## 2014-11-26 DIAGNOSIS — J45909 Unspecified asthma, uncomplicated: Secondary | ICD-10-CM | POA: Insufficient documentation

## 2014-11-26 DIAGNOSIS — N3090 Cystitis, unspecified without hematuria: Secondary | ICD-10-CM | POA: Insufficient documentation

## 2014-11-26 DIAGNOSIS — S3992XA Unspecified injury of lower back, initial encounter: Secondary | ICD-10-CM | POA: Diagnosis present

## 2014-11-26 DIAGNOSIS — Z3202 Encounter for pregnancy test, result negative: Secondary | ICD-10-CM | POA: Diagnosis not present

## 2014-11-26 LAB — URINALYSIS, ROUTINE W REFLEX MICROSCOPIC
Bilirubin Urine: NEGATIVE
Glucose, UA: NEGATIVE mg/dL
Hgb urine dipstick: NEGATIVE
Ketones, ur: NEGATIVE mg/dL
Nitrite: NEGATIVE
PROTEIN: NEGATIVE mg/dL
Specific Gravity, Urine: 1.025 (ref 1.005–1.030)
UROBILINOGEN UA: 0.2 mg/dL (ref 0.0–1.0)
pH: 6.5 (ref 5.0–8.0)

## 2014-11-26 LAB — POC URINE PREG, ED: PREG TEST UR: NEGATIVE

## 2014-11-26 LAB — URINE MICROSCOPIC-ADD ON

## 2014-11-26 MED ORDER — CEPHALEXIN 500 MG PO CAPS: 500.0000 mg | ORAL_CAPSULE | Freq: Four times a day (QID) | ORAL | Status: DC

## 2014-11-26 MED ORDER — OMEPRAZOLE 20 MG PO CPDR: 20.0000 mg | DELAYED_RELEASE_CAPSULE | Freq: Every day | ORAL | Status: DC

## 2014-11-26 NOTE — ED Provider Notes (Signed)
CSN: [id number]     Arrival date & time 11/26/14  1647 History   First MD Initiated Contact with Patient 11/26/14 2004     Chief Complaint  Patient presents with  . Back Pain  . Abdominal Pain     (Consider location/radiation/quality/duration/timing/severity/associated sxs/prior Treatment) Patient is a 29 y.o. female presenting with back pain.  Back Pain Location:  Lumbar spine Quality:  Aching Radiates to:  Does not radiate Pain severity:  Moderate Pain is:  Same all the time Onset quality:  Gradual Duration:  6 months Timing:  Constant Progression:  Unchanged Chronicity:  New Context: not recent illness, not recent injury and not twisting   Relieved by:  Nothing Worsened by:  Standing, touching, twisting and stress Associated symptoms: no abdominal pain, no bladder incontinence, no bowel incontinence, no fever, no numbness, no paresthesias, no perianal numbness, no tingling and no weakness     Past Medical History  Diagnosis Date  . Asthma    Past Surgical History  Procedure Laterality Date  . No past surgeries     History reviewed. No pertinent family history. Social History  Substance Use Topics  . Smoking status: Never Smoker   . Smokeless tobacco: None  . Alcohol Use: No   OB History    Gravida Para Term Preterm AB TAB SAB Ectopic Multiple Living   Review of Systems  Constitutional: Negative for fever.  Gastrointestinal: Negative for abdominal pain and bowel incontinence.  Genitourinary: Negative for bladder incontinence.  Musculoskeletal: Positive for back pain.  Neurological: Negative for tingling, weakness, numbness and paresthesias.  All other systems reviewed and are negative.     Allergies  Review of patient's allergies indicates no known allergies.  Home Medications   Prior to Admission medications   Medication Sig Start Date End Date Taking? Authorizing Provider  ibuprofen (ADVIL,MOTRIN) 200 MG tablet Take 400 mg by  mouth every 6 (six) hours as needed.   Yes Historical Provider, MD  cephALEXin (KEFLEX) 500 MG capsule Take 1 capsule (500 mg total) by mouth 4 (four) times daily. 11/26/14   Mirian Mo, MD  cyclobenzaprine (FLEXERIL) 10 MG tablet Take 1 tablet (10 mg total) by mouth at bedtime. For 1 week, then as needed 09/29/14   Charm Rings, MD  meloxicam (MOBIC) 15 MG tablet Take 1 tablet (15 mg total) by mouth daily. For 1 week, then as needed. 09/29/14   Charm Rings, MD  omeprazole (PRILOSEC) 20 MG capsule Take 1 capsule (20 mg total) by mouth daily. 11/26/14   Mirian Mo, MD  predniSONE (DELTASONE) 50 MG tablet Take 1 pill daily for 5 days. 09/29/14   Charm Rings, MD   BP 100/59 mmHg  Pulse 77  Temp(Src) 97.9 F (36.6 C) (Oral)  Resp 18  SpO2 100%  LMP 11/14/2014 Physical Exam  Constitutional: She is oriented to person, place, and time. She appears well-developed and well-nourished.  HENT:  Head: Normocephalic and atraumatic.  Right Ear: External ear normal.  Left Ear: External ear normal.  Eyes: Conjunctivae and EOM are normal. Pupils are equal, round, and reactive to light.  Neck: Normal range of motion. Neck supple.  Cardiovascular: Normal rate, regular rhythm, normal heart sounds and intact distal pulses.   Pulmonary/Chest: Effort normal and breath sounds normal.  Abdominal: Soft. Bowel sounds are normal. There is generalized tenderness.  Musculoskeletal: Normal range of motion.  Cervical back: Normal.       Thoracic back: Normal.       Lumbar back: She exhibits bony tenderness. She exhibits no tenderness.  No focal neuro deficits of legs  Neurological: She is alert and oriented to person, place, and time.  Skin: Skin is warm and dry.  Vitals reviewed.   ED Course  Procedures (including critical care time) Labs Review Labs Reviewed  URINALYSIS, ROUTINE W REFLEX MICROSCOPIC (NOT AT [facility]) - Abnormal; Notable for the following:    APPearance CLOUDY (*)    Leukocytes, UA  MODERATE (*)    All other components within normal limits  URINE MICROSCOPIC-ADD ON - Abnormal; Notable for the following:    Squamous Epithelial / LPF FEW (*)    Bacteria, UA MANY (*)    All other components within normal limits  POC URINE PREG, ED    Imaging Review No results found. I have personally reviewed and evaluated these images and lab results as part of my medical decision-making.   EKG Interpretation None      MDM   Final diagnoses:  Lumbar strain, initial encounter  Cystitis    29 y.o. female without pertinent PMH presents with atraumatic back pain as above, as well as abd pain.  Symptoms constant for 6 months, no acute change to precipitate visit today.  No fever, gi, respiratory, or gu symptoms.  No dysuria.  Pain constant, worse with movement and palpation.  Exam as above with msk tenderness.  Wu asa bove with signs of UTI. Doubt pyelo given chronic nature of symptoms, no fever or other symptoms.  DC home in stable condition with keflex, prilosec.  I have reviewed all laboratory and imaging studies if ordered as above  1. Lumbar strain, initial encounter   2. Cystitis         Mirian Mo, MD 11/26/14 2130

## 2014-11-26 NOTE — ED Notes (Signed)
Pt here for lower back pain, lower abd pain, and burning sensation. sts x 6 months. sts when she walks it hurts.

## 2014-11-26 NOTE — ED Notes (Signed)
Pt is asking for blood work and an ultrasound to be done.   Pt states that she has been having pain in her low back, hips and pelvis x 6 months. Pt states it hurts worse when she sits and stands, and describes a burning sensation when he sits and urinates. Pt states that she had a "breakdown" at work today. Pt has been seen at Urgent Care for the same, and was given Flexeril for the pain, which she states hasn't helped.

## 2014-11-26 NOTE — ED Notes (Addendum)
Pt ambulatory to room with steady gait

## 2014-11-26 NOTE — Discharge Instructions (Signed)
Back Pain, Adult °Low back pain is very common. About 1 in 5 people have back pain. The cause of low back pain is rarely dangerous. The pain often gets better over time. About half of people with a sudden onset of back pain feel better in just 2 weeks. About 8 in 10 people feel better by 6 weeks.  °CAUSES °Some common causes of back pain include: °· Strain of the muscles or ligaments supporting the spine. °· Wear and tear (degeneration) of the spinal discs. °· Arthritis. °· Direct injury to the back. °DIAGNOSIS °Most of the time, the direct cause of low back pain is not known. However, back pain can be treated effectively even when the exact cause of the pain is unknown. Answering your caregiver's questions about your overall health and symptoms is one of the most accurate ways to make sure the cause of your pain is not dangerous. If your caregiver needs more information, he or she may order lab work or imaging tests (X-rays or MRIs). However, even if imaging tests show changes in your back, this usually does not require surgery. °HOME CARE INSTRUCTIONS °For many people, back pain returns. Since low back pain is rarely dangerous, it is often a condition that people can learn to manage on their own.  °· Remain active. It is stressful on the back to sit or stand in one place. Do not sit, drive, or stand in one place for more than 30 minutes at a time. Take short walks on level surfaces as soon as pain allows. Try to increase the length of time you walk each day. °· Do not stay in bed. Resting more than 1 or 2 days can delay your recovery. °· Do not avoid exercise or work. Your body is made to move. It is not dangerous to be active, even though your back may hurt. Your back will likely heal faster if you return to being active before your pain is gone. °· Pay attention to your body when you  bend and lift. Many people have less discomfort when lifting if they bend their knees, keep the load close to their bodies, and  avoid twisting. Often, the most comfortable positions are those that put less stress on your recovering back. °· Find a comfortable position to sleep. Use a firm mattress and lie on your side with your knees slightly bent. If you lie on your back, put a pillow under your knees. °· Only take over-the-counter or prescription medicines as directed by your caregiver. Over-the-counter medicines to reduce pain and inflammation are often the most helpful. Your caregiver may prescribe muscle relaxant drugs. These medicines help dull your pain so you can more quickly return to your normal activities and healthy exercise. °· Put ice on the injured area. °· Put ice in a plastic bag. °· Place a towel between your skin and the bag. °· Leave the ice on for 15-20 minutes, 03-04 times a day for the first 2 to 3 days. After that, ice and heat may be alternated to reduce pain and spasms. °· Ask your caregiver about trying back exercises and gentle massage. This may be of some benefit. °· Avoid feeling anxious or stressed. Stress increases muscle tension and can worsen back pain. It is important to recognize when you are anxious or stressed and learn ways to manage it. Exercise is a great option. °SEEK MEDICAL CARE IF: °· You have pain that is not relieved with rest or medicine. °· You have pain that does not improve in 1 week. °· You have new symptoms. °· You are generally not feeling well. °SEEK   IMMEDIATE MEDICAL CARE IF:  °· You have pain that radiates from your back into your legs. °· You develop new bowel or bladder control problems. °· You have unusual weakness or numbness in your arms or legs. °· You develop nausea or vomiting. °· You develop abdominal pain. °· You feel faint. °Document Released: 02/21/2005 Document Revised: 08/23/2011 Document Reviewed: 06/25/2013 °ExitCare® Patient Information ©2015 [employer]. This information is not intended to replace advice given to you by your health care provider. Make sure you  discuss any questions you have with your health care provider. ° °Urinary Tract Infection °Urinary tract infections (UTIs) can develop anywhere along your urinary tract. Your urinary tract is your body's drainage system for removing wastes and extra water. Your urinary tract includes two kidneys, two ureters, a bladder, and a urethra. Your kidneys are a pair of bean-shaped organs. Each kidney is about the size of your fist. They are located below your ribs, one on each side of your spine. °CAUSES °Infections are caused by microbes, which are microscopic organisms, including fungi, viruses, and bacteria. These organisms are so small that they can only be seen through a microscope. Bacteria are the microbes that most commonly cause UTIs. °SYMPTOMS  °Symptoms of UTIs may vary by age and gender of the patient and by the location of the infection. Symptoms in young women typically include a frequent and intense urge to urinate and a painful, burning feeling in the bladder or urethra during urination. Older women and men are more likely to be tired, shaky, and weak and have muscle aches and abdominal pain. A fever may mean the infection is in your kidneys. Other symptoms of a kidney infection include pain in your back or sides below the ribs, nausea, and vomiting. °DIAGNOSIS °To diagnose a UTI, your caregiver will ask you about your symptoms. Your caregiver also will ask to provide a urine sample. The urine sample will be tested for bacteria and white blood cells. White blood cells are made by your body to help fight infection. °TREATMENT  °Typically, UTIs can be treated with medication. Because most UTIs are caused by a bacterial infection, they usually can be treated with the use of antibiotics. The choice of antibiotic and length of treatment depend on your symptoms and the type of bacteria causing your infection. °HOME CARE INSTRUCTIONS °· If you were prescribed antibiotics, take them exactly as your caregiver  instructs you. Finish the medication even if you feel better after you have only taken some of the medication. °· Drink enough water and fluids to keep your urine clear or pale yellow. °· Avoid caffeine, tea, and carbonated beverages. They tend to irritate your bladder. °· Empty your bladder often. Avoid holding urine for long periods of time. °· Empty your bladder before and after sexual intercourse. °· After a bowel movement, women should cleanse from front to back. Use each tissue only once. °SEEK MEDICAL CARE IF:  °· You have back pain. °· You develop a fever. °· Your symptoms do not begin to resolve within 3 days. °SEEK IMMEDIATE MEDICAL CARE IF:  °· You have severe back pain or lower abdominal pain. °· You develop chills. °· You have nausea or vomiting. °· You have continued burning or discomfort with urination. °MAKE SURE YOU:  °· Understand these instructions. °· Will watch your condition. °· Will get help right away if you are not doing well or get worse. °Document Released: 12/01/2004 Document Revised: 08/23/2011 Document Reviewed: 04/01/2011 °ExitCare® Patient Information ©  2015 [employer]. This information is not intended to replace advice given to you by your health care provider. Make sure you discuss any questions you have with your health care provider. ° °

## 2015-03-08 NOTE — L&D Delivery Note (Signed)
30 y.o. [id number] at [id number] delivered a viable female infant in cephalic, OA position. 1 loose nuchal cord, patient delivered through, reduced after delivery of infant. Shoulders delivered with ease. 60 sec delayed cord clamping. Cord clamped x2 and cut. Placenta delivered spontaneously intact, with 3VC. Fundus firm on exam with massage and pitocin. Good hemostasis noted.  Laceration: NONE Suture: N/A Good hemostasis noted. EBL: 100cc  Mom and baby recovering in LDR.    Apgars: 9/9 Weight: pending, skin to skin    Jen MowElizabeth Jahni Paul, DO [facility] 03/06/2016, 4:17 AM

## 2015-06-30 ENCOUNTER — Ambulatory Visit: Payer: Medicaid Other | Admitting: Obstetrics

## 2015-06-30 ENCOUNTER — Encounter: Payer: Self-pay | Admitting: Obstetrics

## 2015-06-30 ENCOUNTER — Ambulatory Visit (INDEPENDENT_AMBULATORY_CARE_PROVIDER_SITE_OTHER): Payer: Medicaid Other | Admitting: Obstetrics

## 2015-06-30 VITALS — BP 119/79 | HR 83 | Wt 212.0 lb

## 2015-06-30 DIAGNOSIS — M62838 Other muscle spasm: Secondary | ICD-10-CM

## 2015-06-30 DIAGNOSIS — Z01419 Encounter for gynecological examination (general) (routine) without abnormal findings: Secondary | ICD-10-CM

## 2015-06-30 DIAGNOSIS — Z3049 Encounter for surveillance of other contraceptives: Secondary | ICD-10-CM

## 2015-06-30 MED ORDER — CYCLOBENZAPRINE HCL 10 MG PO TABS: 10.0000 mg | ORAL_TABLET | Freq: Three times a day (TID) | ORAL | Status: DC | PRN

## 2015-06-30 MED ORDER — METHOCARBAMOL 500 MG PO TABS: 500.0000 mg | ORAL_TABLET | Freq: Three times a day (TID) | ORAL | Status: DC

## 2015-06-30 NOTE — Progress Notes (Signed)
Subjective:        Ashley [name] is a 30 y.o. female here for a routine exam.  Current complaints:  Muscle spasms of shoulders that radiate down arms to fingers causing numbness and pain.    Personal health questionnaire:  Is patient [name], have a family history of breast and/or ovarian cancer: no Is there a family history of uterine cancer diagnosed at age < 5750, gastrointestinal cancer, urinary tract cancer, family member who is a Personnel officerLynch syndrome-associated carrier: no Is the patient overweight and hypertensive, family history of diabetes, personal history of gestational diabetes, preeclampsia or PCOS: no Is patient over 3955, have PCOS,  family history of premature CHD under age 30, diabetes, smoke, have hypertension or peripheral artery disease:  no At any time, has a partner hit, kicked or otherwise hurt or frightened you?: no Over the past 2 weeks, have you felt down, depressed or hopeless?: no Over the past 2 weeks, have you felt little interest or pleasure in doing things?:no   Gynecologic History No LMP recorded. Contraception: none Last Pap: Unknown. Results were: unknown Last mammogram: n/a. Results were: n/a  Obstetric History OB History  Gravida Para Term Preterm AB SAB TAB Ectopic Multiple Living  3 2 2  1 1    2     # Outcome Date GA Lbr Len/2nd Weight Sex Delivery Anes PTL Lv  3 Term 06/21/13 6964w5d 00:52 / 00:05 7 lb 2.1 oz (3.235 kg) M Vag-Spont None  Y  2 Term 05/18/11 5451w0d  7 lb 13 oz (3.544 kg) F Vag-Spont EPI  Y  1 SAB 03/07/08              Past Medical History  Diagnosis Date  . Asthma     Past Surgical History  Procedure Laterality Date  . No past surgeries       Current outpatient prescriptions:  .  ibuprofen (ADVIL,MOTRIN) 200 MG tablet, Take 400 mg by mouth every 6 (six) hours as needed., Disp: , Rfl:  .  cyclobenzaprine (FLEXERIL) 10 MG tablet, Take 1 tablet (10 mg total) by mouth 3 (three) times daily as needed for muscle spasms.  For 1 week, then as needed, Disp: 30 tablet, Rfl: 2 .  methocarbamol (ROBAXIN) 500 MG tablet, Take 1 tablet (500 mg total) by mouth 3 (three) times daily., Disp: 90 tablet, Rfl: 2 No Known Allergies  Social History  Substance Use Topics  . Smoking status: Never Smoker   . Smokeless tobacco: Not on file  . Alcohol Use: No    History reviewed. No pertinent family history.    Review of Systems  Constitutional: negative for fatigue and weight loss Respiratory: negative for cough and wheezing Cardiovascular: negative for chest pain, fatigue and palpitations Gastrointestinal: negative for abdominal pain and change in bowel habits Musculoskeletal: positive for myalgias and muscle spasm Neurological: numbness of fingers Behavioral/Psych: negative for abusive relationship, depression Endocrine: negative for temperature intolerance   Genitourinary:negative for abnormal menstrual periods, genital lesions, hot flashes, sexual problems and vaginal discharge Integument/breast: negative for breast lump, breast tenderness, nipple discharge and skin lesion(s)    Objective:       BP 119/79 mmHg  Pulse 83  Wt 212 lb (96.163 kg) General:   alert and no distress  Skin:   no rash or abnormalities  Lungs:   clear to auscultation bilaterally  Heart:   regular rate and rhythm, S1, S2 normal, no murmur, click, rub or gallop  Breasts:   normal  without suspicious masses, skin or nipple changes or axillary nodes  Abdomen:  normal findings: no organomegaly, soft, non-tender and no hernia  Pelvis:  External genitalia: normal general appearance Urinary system: urethral meatus normal and bladder without fullness, nontender Vaginal: normal without tenderness, induration or masses Cervix: normal appearance Adnexa: normal bimanual exam Uterus: anteverted and non-tender, normal size   Lab Review Urine pregnancy test Labs reviewed yes Radiologic studies reviewed no    Assessment:    Healthy female  exam.    Muscle spasm   Plan:   Robaxin / Flexeril Rx  Education reviewed: low fat, low cholesterol diet, safe sex/STD prevention, self breast exams and weight bearing exercise. Follow up in: 1 year.   Meds ordered this encounter  Medications  . DISCONTD: cyclobenzaprine (FLEXERIL) 10 MG tablet    Sig: Take 1 tablet (10 mg total) by mouth 3 (three) times daily as needed for muscle spasms. For 1 week, then as needed    Dispense:  30 tablet    Refill:  2  . DISCONTD: methocarbamol (ROBAXIN) 500 MG tablet    Sig: Take 1 tablet (500 mg total) by mouth 3 (three) times daily.    Dispense:  90 tablet    Refill:  2  . cyclobenzaprine (FLEXERIL) 10 MG tablet    Sig: Take 1 tablet (10 mg total) by mouth 3 (three) times daily as needed for muscle spasms. For 1 week, then as needed    Dispense:  30 tablet    Refill:  2  . methocarbamol (ROBAXIN) 500 MG tablet    Sig: Take 1 tablet (500 mg total) by mouth 3 (three) times daily.    Dispense:  90 tablet    Refill:  2   Orders Placed This Encounter  Procedures  . NuSwab Vaginitis Plus (VG+)  . Ambulatory referral to Neurology    Referral Priority:  Routine    Referral Type:  Consultation    Referral Reason:  Specialty Services Required    Requested Specialty:  Neurology    Number of Visits Requested:  1

## 2015-07-02 LAB — PAP IG W/ RFLX HPV ASCU: PAP Smear Comment: 0

## 2015-07-03 LAB — NUSWAB VAGINITIS PLUS (VG+)
CHLAMYDIA TRACHOMATIS, NAA: NEGATIVE
Candida albicans, NAA: NEGATIVE
Candida glabrata, NAA: NEGATIVE
Neisseria gonorrhoeae, NAA: NEGATIVE
Trich vag by NAA: NEGATIVE

## 2015-07-10 ENCOUNTER — Ambulatory Visit (INDEPENDENT_AMBULATORY_CARE_PROVIDER_SITE_OTHER): Payer: Medicaid Other | Admitting: *Deleted

## 2015-07-10 DIAGNOSIS — Z3201 Encounter for pregnancy test, result positive: Secondary | ICD-10-CM | POA: Diagnosis not present

## 2015-07-10 DIAGNOSIS — N926 Irregular menstruation, unspecified: Secondary | ICD-10-CM

## 2015-07-10 LAB — POCT URINE PREGNANCY: Preg Test, Ur: POSITIVE — AB

## 2015-07-11 ENCOUNTER — Inpatient Hospital Stay (HOSPITAL_COMMUNITY)
Admission: AD | Admit: 2015-07-11 | Discharge: 2015-07-11 | Disposition: A | Discharge status: Home / Self Care | Payer: Medicaid Other | Source: Ambulatory Visit | Attending: Obstetrics | Admitting: Obstetrics

## 2015-07-11 ENCOUNTER — Encounter (HOSPITAL_COMMUNITY): Payer: Self-pay

## 2015-07-11 ENCOUNTER — Inpatient Hospital Stay (HOSPITAL_COMMUNITY): Payer: Medicaid Other

## 2015-07-11 DIAGNOSIS — M545 Low back pain: Secondary | ICD-10-CM | POA: Diagnosis not present

## 2015-07-11 DIAGNOSIS — O9989 Other specified diseases and conditions complicating pregnancy, childbirth and the puerperium: Secondary | ICD-10-CM | POA: Diagnosis not present

## 2015-07-11 DIAGNOSIS — R109 Unspecified abdominal pain: Secondary | ICD-10-CM

## 2015-07-11 DIAGNOSIS — Z3A09 9 weeks gestation of pregnancy: Secondary | ICD-10-CM | POA: Diagnosis not present

## 2015-07-11 DIAGNOSIS — O26891 Other specified pregnancy related conditions, first trimester: Secondary | ICD-10-CM | POA: Insufficient documentation

## 2015-07-11 DIAGNOSIS — R1032 Left lower quadrant pain: Secondary | ICD-10-CM | POA: Diagnosis not present

## 2015-07-11 DIAGNOSIS — O26899 Other specified pregnancy related conditions, unspecified trimester: Secondary | ICD-10-CM

## 2015-07-11 DIAGNOSIS — R1031 Right lower quadrant pain: Secondary | ICD-10-CM | POA: Insufficient documentation

## 2015-07-11 LAB — URINALYSIS, ROUTINE W REFLEX MICROSCOPIC
Bilirubin Urine: NEGATIVE
Glucose, UA: NEGATIVE mg/dL
Hgb urine dipstick: NEGATIVE
KETONES UR: NEGATIVE mg/dL
Nitrite: NEGATIVE
Protein, ur: NEGATIVE mg/dL
Specific Gravity, Urine: 1.02 (ref 1.005–1.030)
pH: 6 (ref 5.0–8.0)

## 2015-07-11 LAB — URINE MICROSCOPIC-ADD ON: RBC / HPF: NONE SEEN RBC/hpf (ref 0–5)

## 2015-07-11 LAB — CBC
HEMATOCRIT: 42.2 % (ref 36.0–46.0)
HEMOGLOBIN: 14.7 g/dL (ref 12.0–15.0)
MCH: 29.9 pg (ref 26.0–34.0)
MCHC: 34.8 g/dL (ref 30.0–36.0)
MCV: 85.9 fL (ref 78.0–100.0)
Platelets: 239 10*3/uL (ref 150–400)
RBC: 4.91 MIL/uL (ref 3.87–5.11)
RDW: 13.5 % (ref 11.5–15.5)
WBC: 7.7 10*3/uL (ref 4.0–10.5)

## 2015-07-11 LAB — HCG, QUANTITATIVE, PREGNANCY: hCG, Beta Chain, Quant, S: 18235 m[IU]/mL — ABNORMAL HIGH (ref <5)

## 2015-07-11 LAB — POCT PREGNANCY, URINE: Preg Test, Ur: POSITIVE — AB

## 2015-07-11 NOTE — MAU Note (Signed)
Back pain and lower abdominal pain x 2 days, denies vaginal bleeding or discharge.

## 2015-07-11 NOTE — MAU Provider Note (Signed)
History     CSN: [id number]  Arrival date and time: 07/11/15 4098   First Provider Initiated Contact with Patient 07/11/15 1017      Chief Complaint  Patient presents with  . Fatigue   HPI Ashley [name]  30 y.o.  [redacted]w[redacted]d Comes to MAU with lower back pain and bilateral lower abdominal pain for a couple of days.  Has been to the office but no exam was done.  Has not taken any Tylenol.  Was at work last night and is still having pain this morning.  Reports she had a physical exam a few weeks ago with a pelvic exam and pap smear.  Records reviewed - done on 06-30-15 and Gc/Chlam were negative.  Denies any nausea but did have that with previous pregnancies.    OB History    Gravida Para Term Preterm AB TAB SAB Ectopic Multiple Living   Past Medical History  Diagnosis Date  . Asthma     Past Surgical History  Procedure Laterality Date  . No past surgeries      History reviewed. No pertinent family history.  Social History  Substance Use Topics  . Smoking status: Never Smoker   . Smokeless tobacco: None  . Alcohol Use: No    Allergies: No Known Allergies  Prescriptions prior to admission  Medication Sig Dispense Refill Last Dose  . Prenatal Vit-Fe Fumarate-FA (PRENATAL MULTIVITAMIN) TABS tablet Take 1 tablet by mouth daily at 12 noon.   07/10/2015 at Unknown time  . cyclobenzaprine (FLEXERIL) 10 MG tablet Take 1 tablet (10 mg total) by mouth 3 (three) times daily as needed for muscle spasms. For 1 week, then as needed (Patient not taking: Reported on 07/11/2015) 30 tablet 2   . methocarbamol (ROBAXIN) 500 MG tablet Take 1 tablet (500 mg total) by mouth 3 (three) times daily. (Patient not taking: Reported on 07/11/2015) 90 tablet 2     Review of Systems  Constitutional: Negative for fever.  Gastrointestinal: Positive for abdominal pain. Negative for diarrhea and constipation.  Genitourinary:       No vaginal discharge. No vaginal bleeding. No dysuria.   Musculoskeletal: Positive for back pain.   Physical Exam   Blood pressure 111/67, pulse 73, temperature 98.3 F (36.8 C), temperature source Oral, resp. rate 17, last menstrual period 05/05/2015, currently breastfeeding.  Physical Exam  Nursing note and vitals reviewed. Constitutional: She is oriented to person, place, and time. She appears well-developed and well-nourished.  HENT:  Head: Normocephalic.  Eyes: EOM are normal.  Neck: Neck supple.  GI: Soft. There is no tenderness.  No FHT heard with doppler  Genitourinary:  Cervix closed  Exam limited by habitus but uterus does not seem to be enlarged.  Musculoskeletal: Normal range of motion.  Neurological: She is alert and oriented to person, place, and time.  Skin: Skin is warm and dry.  Psychiatric: She has a normal mood and affect.    MAU Course  Procedures Results for orders placed or performed during the hospital encounter of 07/11/15 (from the past 24 hour(s))  Urinalysis, Routine w reflex microscopic (not at [facility])     Status: Abnormal   Collection Time: 07/11/15  9:35 AM  Result Value Ref Range   Color, Urine YELLOW YELLOW   APPearance CLEAR CLEAR   Specific Gravity, Urine 1.020 1.005 - 1.030   pH 6.0 5.0 - 8.0   Glucose, UA  NEGATIVE NEGATIVE mg/dL   Hgb urine dipstick NEGATIVE NEGATIVE   Bilirubin Urine NEGATIVE NEGATIVE   Ketones, ur NEGATIVE NEGATIVE mg/dL   Protein, ur NEGATIVE NEGATIVE mg/dL   Nitrite NEGATIVE NEGATIVE   Leukocytes, UA SMALL (A) NEGATIVE  Urine microscopic-add on     Status: Abnormal   Collection Time: 07/11/15  9:35 AM  Result Value Ref Range   Squamous Epithelial / LPF 0-5 (A) NONE SEEN   WBC, UA 0-5 0 - 5 WBC/hpf   RBC / HPF NONE SEEN 0 - 5 RBC/hpf   Bacteria, UA RARE (A) NONE SEEN  Pregnancy, urine POC     Status: Abnormal   Collection Time: 07/11/15  9:43 AM  Result Value Ref Range   Preg Test, Ur POSITIVE (A) NEGATIVE  CBC     Status: None   Collection Time: 07/11/15 11:56 AM   Result Value Ref Range   WBC 7.7 4.0 - 10.5 K/uL   RBC 4.91 3.87 - 5.11 MIL/uL   Hemoglobin 14.7 12.0 - 15.0 g/dL   HCT 16.142.2 09.636.0 - 04.546.0 %   MCV 85.9 78.0 - 100.0 fL   MCH 29.9 26.0 - 34.0 pg   MCHC 34.8 30.0 - 36.0 g/dL   RDW 40.913.5 81.111.5 - 91.415.5 %   Platelets 239 150 - 400 K/uL    MDM EXAM: OBSTETRIC <14 WK US AND TRANSVAGINAL OB US  TECHNIQUE: Both transabdominal and transvaginal ultrasound examinations were performed for complete evaluation of the gestation as well as the maternal uterus, adnexal regions, and pelvic cul-de-sac. Transvaginal technique was performed to assess early pregnancy.  COMPARISON: None.  FINDINGS: Intrauterine gestational sac: Visualized  Yolk sac: Visualized  Embryo: Not visualized  Cardiac Activity: Not visualized  MSD: 12 mm 6 w 0 d  Subchorionic hemorrhage: None visualized.  Maternal uterus/adnexae: Cervical os is closed. There is no extrauterine pelvic or adnexal mass. No free pelvic fluid.  IMPRESSION: Within the uterus, there is a gestational sac containing a yolk sac. Fetal pole not yet seen. Based on gestational sac size, estimated gestational age is 6 weeks. Study otherwise unremarkable.  Advise follow-up study in approximately 14 days to assess for fetal pole and fetal heart activity.   Assessment and Plan  Early pregnancy  Plan No smoking, no drugs, no alcohol.  Take a prenatal vitamin one by mouth every day.  Eat small frequent snacks to avoid nausea.  Begin prenatal care as soon as possible. Drink at least 8 8-oz glasses of water every day. Take Tylenol 325 mg 2 tablets by mouth every 4 hours if needed for pain.    Ashley [name] 07/11/2015, 10:25 AM

## 2015-07-11 NOTE — Discharge Instructions (Signed)
No smoking, no drugs, no alcohol.  Take a prenatal vitamin one by mouth every day.  Eat small frequent snacks to avoid nausea.  Begin prenatal care as soon as possible. °Drink at least 8 8-oz glasses of water every day. °Take Tylenol 325 mg 2 tablets by mouth every 4 hours if needed for pain. ° ° °

## 2015-07-12 LAB — HIV ANTIBODY (ROUTINE TESTING W REFLEX): HIV Screen 4th Generation wRfx: NONREACTIVE

## 2015-07-12 LAB — RPR: RPR: NONREACTIVE

## 2015-07-14 NOTE — Progress Notes (Signed)
Patient came in because she had a missed  Periods. UPT done resulted (+) positive. Informed patient of results & to schedule for a new ob appointment.

## 2015-07-15 ENCOUNTER — Encounter: Payer: Medicaid Other | Admitting: Obstetrics

## 2015-07-20 ENCOUNTER — Ambulatory Visit (INDEPENDENT_AMBULATORY_CARE_PROVIDER_SITE_OTHER): Payer: Medicaid Other | Admitting: Obstetrics

## 2015-07-20 ENCOUNTER — Encounter: Payer: Self-pay | Admitting: Obstetrics

## 2015-07-20 VITALS — BP 111/75 | HR 85 | Temp 98.7°F | Wt 211.0 lb

## 2015-07-20 DIAGNOSIS — O3680X1 Pregnancy with inconclusive fetal viability, fetus 1: Secondary | ICD-10-CM | POA: Diagnosis not present

## 2015-07-20 DIAGNOSIS — Z1389 Encounter for screening for other disorder: Secondary | ICD-10-CM

## 2015-07-20 DIAGNOSIS — Z331 Pregnant state, incidental: Secondary | ICD-10-CM | POA: Diagnosis not present

## 2015-07-20 DIAGNOSIS — Z3491 Encounter for supervision of normal pregnancy, unspecified, first trimester: Secondary | ICD-10-CM

## 2015-07-20 LAB — POCT URINALYSIS DIPSTICK
Bilirubin, UA: NEGATIVE
Blood, UA: NEGATIVE
GLUCOSE UA: NEGATIVE
Ketones, UA: NEGATIVE
Leukocytes, UA: NEGATIVE
NITRITE UA: NEGATIVE
Protein, UA: NEGATIVE
SPEC GRAV UA: 1.01
UROBILINOGEN UA: NEGATIVE
pH, UA: 6

## 2015-07-20 NOTE — Progress Notes (Signed)
  Subjective:    Ashley [name] is a 30 y.o. female being seen today for her obstetrical visit. She is at 5866w2d gestation. Patient reports: backache.  Problem List Items Addressed This Visit    None    Visit Diagnoses    Prenatal care in first trimester    -  Primary    Relevant Orders    POCT Urinalysis Dipstick (Completed)      Patient Active Problem List   Diagnosis Date Noted  . Active labor at term 06/21/2013  . Normal delivery 06/21/2013  . Supervision of other normal pregnancy 11/15/2012    Objective:     BP 111/75 mmHg  Pulse 85  Temp(Src) 98.7 F (37.1 C)  Wt 211 lb (95.709 kg)  LMP 05/05/2015 Uterine Size: Below umbilicus     Assessment:    Pregnancy @ 5166w2d  weeks  Backache  Plan:     Flaxeril Rx  Ultrasound ordered for viability   Problem list reviewed and updated. Labs reviewed.  Follow up in 4 weeks. FIRST/CF mutation testing/NIPT/QUAD SCREEN/fragile X/[name] population testing/Spinal muscular atrophy discussed: requested. Role of ultrasound in pregnancy discussed; fetal survey: requested. Amniocentesis discussed: not indicated.

## 2015-07-21 ENCOUNTER — Encounter: Payer: Self-pay | Admitting: Obstetrics

## 2015-07-22 ENCOUNTER — Ambulatory Visit: Payer: Medicaid Other | Admitting: Neurology

## 2015-07-29 ENCOUNTER — Inpatient Hospital Stay (HOSPITAL_COMMUNITY): Payer: Medicaid Other

## 2015-07-29 ENCOUNTER — Encounter (HOSPITAL_COMMUNITY): Payer: Self-pay

## 2015-07-29 ENCOUNTER — Inpatient Hospital Stay (HOSPITAL_COMMUNITY)
Admission: AD | Admit: 2015-07-29 | Discharge: 2015-07-29 | Disposition: A | Discharge status: Home / Self Care | Payer: Medicaid Other | Source: Ambulatory Visit | Attending: Obstetrics | Admitting: Obstetrics

## 2015-07-29 DIAGNOSIS — R42 Dizziness and giddiness: Secondary | ICD-10-CM | POA: Diagnosis not present

## 2015-07-29 DIAGNOSIS — Z3491 Encounter for supervision of normal pregnancy, unspecified, first trimester: Secondary | ICD-10-CM

## 2015-07-29 DIAGNOSIS — O219 Vomiting of pregnancy, unspecified: Secondary | ICD-10-CM | POA: Diagnosis not present

## 2015-07-29 DIAGNOSIS — Z3A08 8 weeks gestation of pregnancy: Secondary | ICD-10-CM | POA: Diagnosis not present

## 2015-07-29 DIAGNOSIS — O26891 Other specified pregnancy related conditions, first trimester: Secondary | ICD-10-CM | POA: Insufficient documentation

## 2015-07-29 DIAGNOSIS — M549 Dorsalgia, unspecified: Secondary | ICD-10-CM | POA: Diagnosis not present

## 2015-07-29 DIAGNOSIS — R102 Pelvic and perineal pain: Secondary | ICD-10-CM | POA: Insufficient documentation

## 2015-07-29 LAB — URINE MICROSCOPIC-ADD ON

## 2015-07-29 LAB — COMPREHENSIVE METABOLIC PANEL
ALT: 10 U/L — AB (ref 14–54)
AST: 17 U/L (ref 15–41)
Albumin: 3.6 g/dL (ref 3.5–5.0)
Alkaline Phosphatase: 41 U/L (ref 38–126)
Anion gap: 9 (ref 5–15)
BUN: 6 mg/dL (ref 6–20)
CHLORIDE: 106 mmol/L (ref 101–111)
CO2: 21 mmol/L — AB (ref 22–32)
CREATININE: 0.54 mg/dL (ref 0.44–1.00)
Calcium: 8.8 mg/dL — ABNORMAL LOW (ref 8.9–10.3)
GFR calc Af Amer: >60 mL/min (ref >60)
Glucose, Bld: 126 mg/dL — ABNORMAL HIGH (ref 65–99)
Potassium: 4 mmol/L (ref 3.5–5.1)
Sodium: 136 mmol/L (ref 135–145)
Total Bilirubin: 0.9 mg/dL (ref 0.3–1.2)
Total Protein: 6.5 g/dL (ref 6.5–8.1)

## 2015-07-29 LAB — CBC
HCT: 38.2 % (ref 36.0–46.0)
HEMOGLOBIN: 13.5 g/dL (ref 12.0–15.0)
MCH: 29.9 pg (ref 26.0–34.0)
MCHC: 35.3 g/dL (ref 30.0–36.0)
MCV: 84.7 fL (ref 78.0–100.0)
PLATELETS: 221 10*3/uL (ref 150–400)
RBC: 4.51 MIL/uL (ref 3.87–5.11)
RDW: 13.1 % (ref 11.5–15.5)
WBC: 7.4 10*3/uL (ref 4.0–10.5)

## 2015-07-29 LAB — URINALYSIS, ROUTINE W REFLEX MICROSCOPIC
BILIRUBIN URINE: NEGATIVE
GLUCOSE, UA: NEGATIVE mg/dL
HGB URINE DIPSTICK: NEGATIVE
Ketones, ur: 15 mg/dL — AB
NITRITE: NEGATIVE
PH: 6.5 (ref 5.0–8.0)
Protein, ur: NEGATIVE mg/dL
SPECIFIC GRAVITY, URINE: 1.01 (ref 1.005–1.030)

## 2015-07-29 MED ORDER — DEXTROSE IN LACTATED RINGERS 5 % IV SOLN
25.0000 mg | Freq: Once | INTRAVENOUS | Status: AC
  Administered 2015-07-29: 25 mg via INTRAVENOUS
  Filled 2015-07-29: qty 1

## 2015-07-29 MED ORDER — PROMETHAZINE HCL 25 MG PO TABS: 12.5000 mg | ORAL_TABLET | Freq: Four times a day (QID) | ORAL | Status: DC | PRN

## 2015-07-29 NOTE — MAU Note (Signed)
Been throwing up, feeling very sick.  Back aches, dizzy, feels weak.

## 2015-07-29 NOTE — Discharge Instructions (Signed)

## 2015-07-29 NOTE — MAU Provider Note (Signed)
Chief Complaint: Emesis During Pregnancy; Dizziness; and Back Pain   None     SUBJECTIVE HPI: Ashley [name] is a 30 y.o. [id number] at [redacted]w[redacted]d by LMP who presents to maternity admissions reporting nausea/vomiting, dizziness, fatigue, and back and abdominal pain.  She reports nausea x 2 weeks, worsening in last 24 hours with vomiting x 3-4 in 24 hours. She has not taken anything for nausea. She reports she has been unable to keep down any food or fluids for 24 hours.  She also reports dizziness and fatigue for 2+ weeks, not worsening, but not improving.  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  She reports abdominal cramping and back pain that are constant x 1 week.  She was seen for pain in MAU on 07/11/15 and had IUP on [place] with GS and YS but no fetal pole.  She has follow up US ordered by Dr Clearance Coots on  08/04/15.  She does report her abdominal pain is worse today than it has been. She has not tried any treatments for her pain.   She denies vaginal bleeding, vaginal itching/burning, urinary symptoms, h/a, dizziness, or fever/chills.     HPI  Past Medical History  Diagnosis Date  . Asthma    Past Surgical History  Procedure Laterality Date  . No past surgeries     Social History   Social History  . Marital Status: Single    Spouse Name: N/A  . Number of Children: N/A  . Years of Education: N/A   Occupational History  . Not on file.   Social History Main Topics  . Smoking status: Never Smoker   . Smokeless tobacco: Not on file  . Alcohol Use: No  . Drug Use: No  . Sexual Activity: Yes    Birth Control/ Protection: Condom   Other Topics Concern  . Not on file   Social History Narrative   No current facility-administered medications on file prior to encounter.   Current Outpatient Prescriptions on File Prior to Encounter  Medication Sig Dispense Refill  . Prenatal Vit-Fe Fumarate-FA (PRENATAL MULTIVITAMIN) TABS tablet Take 1 tablet by mouth  daily at 12 noon.     No Known Allergies  ROS:  Review of Systems  Constitutional: Negative for fever, chills and fatigue.  Respiratory: Negative for shortness of breath.   Cardiovascular: Negative for chest pain.  Gastrointestinal: Positive for nausea and vomiting.  Genitourinary: Positive for pelvic pain. Negative for dysuria, flank pain, vaginal bleeding, vaginal discharge, difficulty urinating and vaginal pain.  Musculoskeletal: Positive for back pain.  Neurological: Negative for dizziness and headaches.  Psychiatric/Behavioral: Negative.      I have reviewed patient's Past Medical Hx, Surgical Hx, Family Hx, Social Hx, medications and allergies.   Physical Exam   Patient Vitals for the past 24 hrs:  BP Temp Temp src Pulse Resp Weight  07/29/15 1600 112/68 mmHg 98.4 F (36.9 C) Oral 76 18 208 lb 12.8 oz (94.711 kg)   Constitutional: Well-developed, well-nourished female in no acute distress.  Cardiovascular: normal rate Respiratory: normal effort GI: Abd soft, non-tender. Pos BS x 4 MS: Extremities nontender, no edema, normal ROM Neurologic: Alert and oriented x 4.  GU: Neg CVAT.    LAB RESULTS Results for orders placed or performed during the hospital encounter of 07/29/15 (from the past 24 hour(s))  Urinalysis, Routine w reflex microscopic (not at [facility])     Status: Abnormal   Collection Time: 07/29/15  4:05  PM  Result Value Ref Range   Color, Urine YELLOW YELLOW   APPearance CLEAR CLEAR   Specific Gravity, Urine 1.010 1.005 - 1.030   pH 6.5 5.0 - 8.0   Glucose, UA NEGATIVE NEGATIVE mg/dL   Hgb urine dipstick NEGATIVE NEGATIVE   Bilirubin Urine NEGATIVE NEGATIVE   Ketones, ur 15 (A) NEGATIVE mg/dL   Protein, ur NEGATIVE NEGATIVE mg/dL   Nitrite NEGATIVE NEGATIVE   Leukocytes, UA MODERATE (A) NEGATIVE  Urine microscopic-add on     Status: Abnormal   Collection Time: 07/29/15  4:05 PM  Result Value Ref Range   Squamous Epithelial / LPF 6-30 (A) NONE SEEN    WBC, UA 6-30 0 - 5 WBC/hpf   RBC / HPF 0-5 0 - 5 RBC/hpf   Bacteria, UA FEW (A) NONE SEEN  CBC     Status: None   Collection Time: 07/29/15  5:49 PM  Result Value Ref Range   WBC 7.4 4.0 - 10.5 K/uL   RBC 4.51 3.87 - 5.11 MIL/uL   Hemoglobin 13.5 12.0 - 15.0 g/dL   HCT 16.138.2 09.636.0 - 04.546.0 %   MCV 84.7 78.0 - 100.0 fL   MCH 29.9 26.0 - 34.0 pg   MCHC 35.3 30.0 - 36.0 g/dL   RDW 40.913.1 81.111.5 - 91.415.5 %   Platelets 221 150 - 400 K/uL  Comprehensive metabolic panel     Status: Abnormal   Collection Time: 07/29/15  5:49 PM  Result Value Ref Range   Sodium 136 135 - 145 mmol/L   Potassium 4.0 3.5 - 5.1 mmol/L   Chloride 106 101 - 111 mmol/L   CO2 21 (L) 22 - 32 mmol/L   Glucose, Bld 126 (H) 65 - 99 mg/dL   BUN 6 6 - 20 mg/dL   Creatinine, Ser 7.820.54 0.44 - 1.00 mg/dL   Calcium 8.8 (L) 8.9 - 10.3 mg/dL   Total Protein 6.5 6.5 - 8.1 g/dL   Albumin 3.6 3.5 - 5.0 g/dL   AST 17 15 - 41 U/L   ALT 10 (L) 14 - 54 U/L   Alkaline Phosphatase 41 38 - 126 U/L   Total Bilirubin 0.9 0.3 - 1.2 mg/dL   GFR calc non Af Amer >60 >60 mL/min   GFR calc Af Amer >60 >60 mL/min   Anion gap 9 5 - 15       IMAGING [place] Ob Comp Less 14 Wks  07/11/2015  CLINICAL DATA:  Two day history abdominal pain EXAM: OBSTETRIC <14 WK US AND TRANSVAGINAL OB US TECHNIQUE: Both transabdominal and transvaginal ultrasound examinations were performed for complete evaluation of the gestation as well as the maternal uterus, adnexal regions, and pelvic cul-de-sac. Transvaginal technique was performed to assess early pregnancy. COMPARISON:  None. FINDINGS: Intrauterine gestational sac: Visualized Yolk sac:  Visualized Embryo:  Not visualized Cardiac Activity: Not visualized MSD: 12  mm   6 w   0  d Subchorionic hemorrhage:  None visualized. Maternal uterus/adnexae: Cervical os is closed. There is no extrauterine pelvic or adnexal mass. No free pelvic fluid. IMPRESSION: Within the uterus, there is a gestational sac containing a yolk sac. Fetal  pole not yet seen. Based on gestational sac size, estimated gestational age is 6 weeks. Study otherwise unremarkable. Advise follow-up study in approximately 14 days to assess for fetal pole and fetal heart activity. Electronically Signed   By: Bretta BangWilliam  Woodruff III M.D.   On: 07/11/2015 11:31   [place] Ob Transvaginal  07/29/2015  CLINICAL DATA:  Pelvic pain in first trimester pregnancy EXAM: TRANSVAGINAL OB ULTRASOUND TECHNIQUE: Transvaginal ultrasound was performed for complete evaluation of the gestation as well as the maternal uterus, adnexal regions, and pelvic cul-de-sac. COMPARISON:  07/11/2015 FINDINGS: Intrauterine gestational sac: Present Yolk sac:  Present Embryo:  Present Cardiac Activity: Present Heart Rate: 164 bpm CRL:   16.2  Mm   8 w 0 d                  [place] EDC: 04/06/2016 Subchorionic hemorrhage:  None visualized. Maternal uterus/adnexae: Right ovary not visualized. Unremarkable left ovary. IMPRESSION: Single living intrauterine pregnancy measuring 8 weeks. No adverse finding. Electronically Signed   By: Marnee Spring M.D.   On: 07/29/2015 18:52   US Ob Transvaginal  07/11/2015  CLINICAL DATA:  Two day history abdominal pain EXAM: OBSTETRIC <14 WK [place] AND TRANSVAGINAL OB US TECHNIQUE: Both transabdominal and transvaginal ultrasound examinations were performed for complete evaluation of the gestation as well as the maternal uterus, adnexal regions, and pelvic cul-de-sac. Transvaginal technique was performed to assess early pregnancy. COMPARISON:  None. FINDINGS: Intrauterine gestational sac: Visualized Yolk sac:  Visualized Embryo:  Not visualized Cardiac Activity: Not visualized MSD: 12  mm   6 w   0  d Subchorionic hemorrhage:  None visualized. Maternal uterus/adnexae: Cervical os is closed. There is no extrauterine pelvic or adnexal mass. No free pelvic fluid. IMPRESSION: Within the uterus, there is a gestational sac containing a yolk sac. Fetal pole not yet seen. Based on gestational sac size,  estimated gestational age is 6 weeks. Study otherwise unremarkable. Advise follow-up study in approximately 14 days to assess for fetal pole and fetal heart activity. Electronically Signed   By: Bretta Bang III M.D.   On: 07/11/2015 11:31    MAU Management/MDM: Ordered labs and reviewed results.  IUP with [facility] on [place].    Treatments in MAU included D5LR x 1000 ml and Phenergan 25 mg IV.  Nausea improved after treatment.  Pt to f/u in office as scheduled, need to discuss Diclegis as option for nausea.  Phenergan 12.5-25 mg PO Q 6 hours PRN Rx sent to pharmacy.   Pt stable at time of discharge.  ASSESSMENT 1. Normal IUP (intrauterine pregnancy) on prenatal ultrasound, first trimester   2. Nausea and vomiting during pregnancy prior to [redacted] weeks gestation     PLAN Discharge home   Medication List    TAKE these medications        acetaminophen 325 MG tablet  Commonly known as:  TYLENOL  Take 650 mg by mouth every 6 (six) hours as needed for mild pain.     prenatal multivitamin Tabs tablet  Take 1 tablet by mouth daily at 12 noon.     promethazine 25 MG tablet  Commonly known as:  PHENERGAN  Take 0.5-1 tablets (12.5-25 mg total) by mouth every 6 (six) hours as needed.       Follow-up Information    Follow up with [name],[name], MD.   Specialty:  Obstetrics and Gynecology   Why:  As scheduled, Return to MAU as needed for emergencies   Contact information:   [address] [place] [address] [phone]       Sharen Counter Certified Nurse-Midwife 07/29/2015  8:04 PM

## 2015-07-30 ENCOUNTER — Ambulatory Visit (INDEPENDENT_AMBULATORY_CARE_PROVIDER_SITE_OTHER): Payer: Medicaid Other | Admitting: Obstetrics

## 2015-07-30 ENCOUNTER — Encounter: Payer: Self-pay | Admitting: *Deleted

## 2015-07-30 VITALS — BP 121/76 | HR 83 | Wt 211.0 lb

## 2015-07-30 DIAGNOSIS — O219 Vomiting of pregnancy, unspecified: Secondary | ICD-10-CM | POA: Diagnosis not present

## 2015-07-30 DIAGNOSIS — Z3491 Encounter for supervision of normal pregnancy, unspecified, first trimester: Secondary | ICD-10-CM

## 2015-08-03 ENCOUNTER — Encounter: Payer: Self-pay | Admitting: Obstetrics

## 2015-08-03 NOTE — Progress Notes (Signed)
  Subjective:    Ashley [name] is a 30 y.o. female being seen today for her obstetrical visit. She is at 3972w2d gestation. Patient reports: no complaints.  Problem List Items Addressed This Visit    None    Visit Diagnoses    Nausea and vomiting during pregnancy prior to [redacted] weeks gestation    -  Primary      Patient Active Problem List   Diagnosis Date Noted  . Active labor at term 06/21/2013  . Normal delivery 06/21/2013  . Supervision of other normal pregnancy 11/15/2012    Objective:     BP 121/76 mmHg  Pulse 83  Wt 211 lb (95.709 kg)  LMP 05/05/2015 Uterine Size: Below umbilicus     Assessment:    Pregnancy @ 6472w2d  weeks Doing well    Plan:    Problem list reviewed and updated. Labs reviewed.  Follow up in 4 weeks. FIRST/CF mutation testing/NIPT/QUAD SCREEN/fragile X/[name] population testing/Spinal muscular atrophy discussed: requested. Role of ultrasound in pregnancy discussed; fetal survey: requested. Amniocentesis discussed: not indicated.

## 2015-08-04 ENCOUNTER — Other Ambulatory Visit: Payer: Medicaid Other

## 2015-08-11 ENCOUNTER — Ambulatory Visit (INDEPENDENT_AMBULATORY_CARE_PROVIDER_SITE_OTHER): Payer: Medicaid Other | Admitting: Obstetrics

## 2015-08-11 ENCOUNTER — Encounter: Payer: Self-pay | Admitting: Obstetrics

## 2015-08-11 VITALS — BP 109/65 | HR 87 | Temp 99.3°F | Wt 216.0 lb

## 2015-08-11 DIAGNOSIS — O219 Vomiting of pregnancy, unspecified: Secondary | ICD-10-CM

## 2015-08-11 DIAGNOSIS — Z3491 Encounter for supervision of normal pregnancy, unspecified, first trimester: Secondary | ICD-10-CM

## 2015-08-11 NOTE — Progress Notes (Signed)
  Subjective:    Waldemar DickensMitray Counihan is a 30 y.o. female being seen today for her obstetrical visit. She is at 738w3d gestation. Patient reports: nausea.  Problem List Items Addressed This Visit    None     Patient Active Problem List   Diagnosis Date Noted  . Active labor at term 06/21/2013  . Normal delivery 06/21/2013  . Supervision of other normal pregnancy 11/15/2012    Objective:     BP 109/65 mmHg  Pulse 87  Temp(Src) 99.3 F (37.4 C)  Wt 216 lb (97.977 kg)  LMP 05/05/2015 (LMP Unknown) Uterine Size: Below umbilicus     Assessment:    Pregnancy @ 8138w3d  weeks Doing well    Plan:    Problem list reviewed and updated. Labs reviewed.  Follow up in 4 weeks. FIRST/CF mutation testing/NIPT/QUAD SCREEN/fragile X/[name] population testing/Spinal muscular atrophy discussed: requested. Role of ultrasound in pregnancy discussed; fetal survey: requested. Amniocentesis discussed: not indicated.

## 2015-08-13 ENCOUNTER — Telehealth: Payer: Self-pay | Admitting: Obstetrics

## 2015-08-13 NOTE — Telephone Encounter (Signed)
Pt needs refill on Phenergan and prenatals; she is currently out. [facility] [address]. Please advise

## 2015-08-17 ENCOUNTER — Encounter: Payer: Medicaid Other | Admitting: Obstetrics

## 2015-08-17 NOTE — Telephone Encounter (Signed)
Please review for refill.  

## 2015-08-24 ENCOUNTER — Encounter: Payer: Self-pay | Admitting: *Deleted

## 2015-09-09 ENCOUNTER — Encounter: Payer: Medicaid Other | Admitting: Certified Nurse Midwife

## 2015-09-16 ENCOUNTER — Encounter: Payer: Self-pay | Admitting: Obstetrics

## 2015-09-16 ENCOUNTER — Ambulatory Visit (INDEPENDENT_AMBULATORY_CARE_PROVIDER_SITE_OTHER): Payer: Medicaid Other | Admitting: Obstetrics

## 2015-09-16 VITALS — BP 104/70 | HR 81 | Temp 97.9°F | Wt 219.0 lb

## 2015-09-16 DIAGNOSIS — Z3492 Encounter for supervision of normal pregnancy, unspecified, second trimester: Secondary | ICD-10-CM | POA: Diagnosis not present

## 2015-09-16 DIAGNOSIS — Z331 Pregnant state, incidental: Secondary | ICD-10-CM

## 2015-09-16 DIAGNOSIS — Z1389 Encounter for screening for other disorder: Secondary | ICD-10-CM

## 2015-09-16 DIAGNOSIS — Z3689 Encounter for other specified antenatal screening: Secondary | ICD-10-CM

## 2015-09-16 DIAGNOSIS — K219 Gastro-esophageal reflux disease without esophagitis: Secondary | ICD-10-CM

## 2015-09-16 LAB — POCT URINALYSIS DIPSTICK
Bilirubin, UA: NEGATIVE
Blood, UA: NEGATIVE
Glucose, UA: NEGATIVE
KETONES UA: NEGATIVE
Nitrite, UA: NEGATIVE
PH UA: 7
SPEC GRAV UA: 1.01
UROBILINOGEN UA: NEGATIVE

## 2015-09-16 MED ORDER — OMEPRAZOLE 20 MG PO CPDR: 20.0000 mg | DELAYED_RELEASE_CAPSULE | Freq: Two times a day (BID) | ORAL | Status: DC

## 2015-09-16 MED ORDER — PRENATE MINI 29-0.6-0.4-350 MG PO CAPS: 1.0000 | ORAL_CAPSULE | Freq: Every day | ORAL | Status: DC

## 2015-09-16 NOTE — Progress Notes (Signed)
  Subjective:    Ashley [name] is a 30 y.o. female being seen today for her obstetrical visit. She is at 635w4d gestation. Patient reports: heartburn.  Problem List Items Addressed This Visit    None    Visit Diagnoses    Prenatal care in second trimester    -  Primary    Relevant Medications    Prenat w/o A-FeCbn-Meth-FA-DHA (PRENATE MINI) 29-0.6-0.4-350 MG CAPS    Other Relevant Orders    POCT urinalysis dipstick (Completed)    Encounter for fetal anatomic survey        Relevant Orders    US OB Comp + 14 Wk    US OB Transvaginal    GERD without esophagitis        Relevant Medications    omeprazole (PRILOSEC) 20 MG capsule      Patient Active Problem List   Diagnosis Date Noted  . Active labor at term 06/21/2013  . Normal delivery 06/21/2013  . Supervision of other normal pregnancy 11/15/2012    Objective:     BP 104/70 mmHg  Pulse 81  Temp(Src) 97.9 F (36.6 C)  Wt 219 lb (99.338 kg)  LMP 05/05/2015 (LMP Unknown) Uterine Size: Below umbilicus     Assessment:    Pregnancy @ 1035w4d  weeks Doing well    Plan:    Problem list reviewed and updated. Labs reviewed.  Follow up in 4 weeks. FIRST/CF mutation testing/NIPT/QUAD SCREEN/fragile X/[name] population testing/Spinal muscular atrophy discussed: requested. Role of ultrasound in pregnancy discussed; fetal survey: requested. Amniocentesis discussed: not indicated.

## 2015-09-16 NOTE — Addendum Note (Signed)
Addended by: Marya LandryFOSTER, Moyinoluwa Dawe D on: 09/16/2015 05:46 PM   Modules accepted: Orders

## 2015-09-16 NOTE — Progress Notes (Signed)
Patient is doing well- she is doing much better with nausea

## 2015-09-17 LAB — PRENATAL PROFILE I(LABCORP)
Antibody Screen: NEGATIVE
BASOS ABS: 0 10*3/uL (ref 0.0–0.2)
Basos: 0 %
EOS (ABSOLUTE): 0.5 10*3/uL — AB (ref 0.0–0.4)
Eos: 5 %
HEMOGLOBIN: 12.4 g/dL (ref 11.1–15.9)
Hematocrit: 35.7 % (ref 34.0–46.6)
Hepatitis B Surface Ag: NEGATIVE
IMMATURE GRANS (ABS): 0.1 10*3/uL (ref 0.0–0.1)
Immature Granulocytes: 1 %
LYMPHS: 23 %
Lymphocytes Absolute: 2.2 10*3/uL (ref 0.7–3.1)
MCH: 29.6 pg (ref 26.6–33.0)
MCHC: 34.7 g/dL (ref 31.5–35.7)
MCV: 85 fL (ref 79–97)
MONOCYTES: 6 %
MONOS ABS: 0.6 10*3/uL (ref 0.1–0.9)
Neutrophils Absolute: 6.3 10*3/uL (ref 1.4–7.0)
Neutrophils: 65 %
PLATELETS: 215 10*3/uL (ref 150–379)
RBC: 4.19 x10E6/uL (ref 3.77–5.28)
RDW: 13.7 % (ref 12.3–15.4)
RPR: NONREACTIVE
Rh Factor: POSITIVE
Rubella Antibodies, IGG: 12.4 index (ref >0.99)
WBC: 9.7 10*3/uL (ref 3.4–10.8)

## 2015-09-17 LAB — HEMOGLOBINOPATHY EVALUATION
HGB A: 97.3 % (ref 94.0–98.0)
HGB C: 0 %
HGB S: 0 %
Hemoglobin A2 Quantitation: 2.7 % (ref 0.7–3.1)
Hemoglobin F Quantitation: 0 % (ref 0.0–2.0)

## 2015-09-17 LAB — VARICELLA ZOSTER ANTIBODY, IGG: VARICELLA: 196 {index} (ref >165)

## 2015-09-17 LAB — VITAMIN D 25 HYDROXY (VIT D DEFICIENCY, FRACTURES): Vit D, 25-Hydroxy: 30 ng/mL (ref 30.0–100.0)

## 2015-09-17 LAB — HIV ANTIBODY (ROUTINE TESTING W REFLEX): HIV Screen 4th Generation wRfx: NONREACTIVE

## 2015-09-19 LAB — URINE CULTURE, OB REFLEX

## 2015-09-19 LAB — CULTURE, OB URINE

## 2015-09-20 ENCOUNTER — Inpatient Hospital Stay (HOSPITAL_COMMUNITY)
Admission: AD | Admit: 2015-09-20 | Discharge: 2015-09-20 | Disposition: A | Discharge status: Home / Self Care | Payer: Medicaid Other | Source: Ambulatory Visit | Attending: Obstetrics & Gynecology | Admitting: Obstetrics & Gynecology

## 2015-09-20 ENCOUNTER — Encounter (HOSPITAL_COMMUNITY): Payer: Self-pay | Admitting: *Deleted

## 2015-09-20 DIAGNOSIS — Z79899 Other long term (current) drug therapy: Secondary | ICD-10-CM | POA: Diagnosis not present

## 2015-09-20 DIAGNOSIS — R102 Pelvic and perineal pain: Secondary | ICD-10-CM | POA: Insufficient documentation

## 2015-09-20 DIAGNOSIS — M549 Dorsalgia, unspecified: Secondary | ICD-10-CM | POA: Diagnosis not present

## 2015-09-20 DIAGNOSIS — J45909 Unspecified asthma, uncomplicated: Secondary | ICD-10-CM | POA: Insufficient documentation

## 2015-09-20 DIAGNOSIS — Z3A16 16 weeks gestation of pregnancy: Secondary | ICD-10-CM | POA: Diagnosis not present

## 2015-09-20 DIAGNOSIS — N949 Unspecified condition associated with female genital organs and menstrual cycle: Secondary | ICD-10-CM

## 2015-09-20 DIAGNOSIS — O99512 Diseases of the respiratory system complicating pregnancy, second trimester: Secondary | ICD-10-CM | POA: Insufficient documentation

## 2015-09-20 DIAGNOSIS — O26892 Other specified pregnancy related conditions, second trimester: Secondary | ICD-10-CM | POA: Insufficient documentation

## 2015-09-20 DIAGNOSIS — O99891 Other specified diseases and conditions complicating pregnancy: Secondary | ICD-10-CM

## 2015-09-20 DIAGNOSIS — O9989 Other specified diseases and conditions complicating pregnancy, childbirth and the puerperium: Secondary | ICD-10-CM

## 2015-09-20 LAB — URINE MICROSCOPIC-ADD ON: RBC / HPF: NONE SEEN RBC/hpf (ref 0–5)

## 2015-09-20 LAB — WET PREP, GENITAL
CLUE CELLS WET PREP: NONE SEEN
Sperm: NONE SEEN
TRICH WET PREP: NONE SEEN
YEAST WET PREP: NONE SEEN

## 2015-09-20 LAB — URINALYSIS, ROUTINE W REFLEX MICROSCOPIC
BILIRUBIN URINE: NEGATIVE
GLUCOSE, UA: NEGATIVE mg/dL
Hgb urine dipstick: NEGATIVE
KETONES UR: NEGATIVE mg/dL
Nitrite: NEGATIVE
PH: 6.5 (ref 5.0–8.0)
PROTEIN: NEGATIVE mg/dL
Specific Gravity, Urine: 1.02 (ref 1.005–1.030)

## 2015-09-20 MED ORDER — CYCLOBENZAPRINE HCL 10 MG PO TABS
10.0000 mg | ORAL_TABLET | Freq: Once | ORAL | Status: AC
  Administered 2015-09-20: 10 mg via ORAL
  Filled 2015-09-20: qty 1

## 2015-09-20 MED ORDER — CYCLOBENZAPRINE HCL 10 MG PO TABS: 10.0000 mg | ORAL_TABLET | Freq: Three times a day (TID) | ORAL | Status: DC | PRN

## 2015-09-20 NOTE — Discharge Instructions (Signed)

## 2015-09-20 NOTE — MAU Provider Note (Signed)
History     CSN: [id number]  Arrival date and time: 09/20/15 1415   First Provider Initiated Contact with Patient 09/20/15 1458      Chief Complaint  Patient presents with  . Pelvic Pain   HPI Comments: Ashley [name] is a 30 y.o. [id number] at 699w1d who presents with bilateral lower abdominal pain that radiates to her back and down her legs. She states that she has had the pain for about 4 days. She has a history of back pain, and has been given flexeril. She has not taken it at this time.   Pelvic Pain The patient's primary symptoms include pelvic pain. This is a new problem. The current episode started in the past 7 days. The problem occurs constantly. The problem has been unchanged. Pain severity now: 10/10  The problem affects both sides. She is pregnant. Associated symptoms include abdominal pain and back pain. Pertinent negatives include no chills, constipation, diarrhea, dysuria, fever, frequency, nausea, urgency or vomiting. The vaginal discharge was normal. There has been no bleeding. Nothing aggravates the symptoms. She has tried nothing (has rx for flexeril that she has not taken. ) for the symptoms.    Past Medical History  Diagnosis Date  . Asthma     Past Surgical History  Procedure Laterality Date  . No past surgeries      No family history on file.  Social History  Substance Use Topics  . Smoking status: Never Smoker   . Smokeless tobacco: None  . Alcohol Use: No    Allergies: No Known Allergies  Prescriptions prior to admission  Medication Sig Dispense Refill Last Dose  . acetaminophen (TYLENOL) 325 MG tablet Take 650 mg by mouth every 6 (six) hours as needed for mild pain.   Taking  . omeprazole (PRILOSEC) 20 MG capsule Take 1 capsule (20 mg total) by mouth 2 (two) times daily before a meal. 60 capsule 5   . Prenat w/o A-FeCbn-Meth-FA-DHA (PRENATE MINI) 29-0.6-0.4-350 MG CAPS Take 1 capsule by mouth daily before breakfast. 90 capsule 3   . Prenatal Vit-Fe  Fumarate-FA (PRENATAL MULTIVITAMIN) TABS tablet Take 1 tablet by mouth daily at 12 noon.   Taking  . promethazine (PHENERGAN) 25 MG tablet Take 0.5-1 tablets (12.5-25 mg total) by mouth every 6 (six) hours as needed. 30 tablet 2 Taking    Review of Systems  Constitutional: Negative for fever and chills.  Gastrointestinal: Positive for abdominal pain. Negative for nausea, vomiting, diarrhea and constipation.  Genitourinary: Positive for pelvic pain. Negative for dysuria, urgency and frequency.  Musculoskeletal: Positive for back pain.   Physical Exam   Blood pressure 117/70, pulse 91, temperature 98 F (36.7 C), temperature source Oral, resp. rate 18, last menstrual period 05/05/2015, currently breastfeeding.  Physical Exam  Vitals reviewed. Constitutional: She is oriented to person, place, and time. She appears well-developed and well-nourished. No distress.  HENT:  Head: Normocephalic.  Cardiovascular: Normal rate.   Respiratory: Effort normal.  GI: Soft. There is no tenderness. There is no rebound.  Genitourinary:   External: no lesion Vagina: small amount of white discharge Cervix: pink, smooth, no CMT Uterus: AGA, FHT 148 with doppler    Neurological: She is alert and oriented to person, place, and time.  Skin: Skin is warm and dry.  Psychiatric: She has a normal mood and affect.   Results for orders placed or performed during the hospital encounter of 09/20/15 (from the past 24 hour(s))  Urinalysis, Routine w reflex microscopic (not at  [facility])     Status: Abnormal   Collection Time: 09/20/15  2:35 PM  Result Value Ref Range   Color, Urine YELLOW YELLOW   APPearance CLEAR CLEAR   Specific Gravity, Urine 1.020 1.005 - 1.030   pH 6.5 5.0 - 8.0   Glucose, UA NEGATIVE NEGATIVE mg/dL   Hgb urine dipstick NEGATIVE NEGATIVE   Bilirubin Urine NEGATIVE NEGATIVE   Ketones, ur NEGATIVE NEGATIVE mg/dL   Protein, ur NEGATIVE NEGATIVE mg/dL   Nitrite NEGATIVE NEGATIVE    Leukocytes, UA TRACE (A) NEGATIVE  Urine microscopic-add on     Status: Abnormal   Collection Time: 09/20/15  2:35 PM  Result Value Ref Range   Squamous Epithelial / LPF 0-5 (A) NONE SEEN   WBC, UA 0-5 0 - 5 WBC/hpf   RBC / HPF NONE SEEN 0 - 5 RBC/hpf   Bacteria, UA FEW (A) NONE SEEN   Urine-Other MUCOUS PRESENT   Wet prep, genital     Status: Abnormal   Collection Time: 09/20/15  3:18 PM  Result Value Ref Range   Yeast Wet Prep HPF POC NONE SEEN NONE SEEN   Trich, Wet Prep NONE SEEN NONE SEEN   Clue Cells Wet Prep HPF POC NONE SEEN NONE SEEN   WBC, Wet Prep HPF POC MODERATE (A) NONE SEEN   Sperm NONE SEEN     MAU Course  Procedures  MDM Patient has had flexeril for pain. She reports that her pain is better.   Assessment and Plan   1. Back pain affecting pregnancy in second trimester   2. Round ligament pain   3. [redacted] weeks gestation of pregnancy    DC home Comfort measures reviewed  2nd Trimester precautions  PTL precautions  Fetal kick counts RX: flexeril TIDPRN #[id number]  Return to MAU as needed FU with OB as planned  Follow-up Information    Follow up with Brock Bad, MD.   Specialty:  Obstetrics and Gynecology   Why:  As scheduled   Contact information:   [address] [place] [address] [phone]         Tawnya Crook 09/20/2015, 3:04 PM

## 2015-09-20 NOTE — MAU Provider Note (Signed)
History     CSN: [id number]  Arrival date and time: 09/20/15 1415   First Provider Initiated Contact with Patient 09/20/15 1458      Chief Complaint  Patient presents with  . Pelvic Pain   HPI   Ashley [name] is a 30 y.o., [id number], [redacted]w[redacted]d who presents with bilateral back, hip, and leg pain. She says the pain started 3 days ago when she had mild cold symptoms and was coughing. The pain is now constant and is worse with standing straight up. She rates it a 10/10 today and tried Tylenol with little relief. She denies headache, chest pain, SOB, N/V/D/C, UTI sx, vaginal discharge or itching, vaginal bleeding, leaking of fluid or contractions. She endorses numbness and tingling in her legs bilaterally and good fetal movement.     Past Medical History  Diagnosis Date  . Asthma     Past Surgical History  Procedure Laterality Date  . No past surgeries      No family history on file.  Social History  Substance Use Topics  . Smoking status: Never Smoker   . Smokeless tobacco: None  . Alcohol Use: No    Allergies: No Known Allergies  Prescriptions prior to admission  Medication Sig Dispense Refill Last Dose  . acetaminophen (TYLENOL) 325 MG tablet Take 650 mg by mouth every 6 (six) hours as needed for mild pain.   Taking  . omeprazole (PRILOSEC) 20 MG capsule Take 1 capsule (20 mg total) by mouth 2 (two) times daily before a meal. 60 capsule 5   . Prenat w/o A-FeCbn-Meth-FA-DHA (PRENATE MINI) 29-0.6-0.4-350 MG CAPS Take 1 capsule by mouth daily before breakfast. 90 capsule 3   . Prenatal Vit-Fe Fumarate-FA (PRENATAL MULTIVITAMIN) TABS tablet Take 1 tablet by mouth daily at 12 noon.   Taking  . promethazine (PHENERGAN) 25 MG tablet Take 0.5-1 tablets (12.5-25 mg total) by mouth every 6 (six) hours as needed. 30 tablet 2 Taking    Review of Systems  Constitutional: Negative for fever.  Respiratory: Negative for shortness of breath.   Cardiovascular: Negative for chest pain.   Gastrointestinal: Negative for nausea, vomiting, diarrhea and constipation.  Genitourinary: Positive for dysuria (Pain in hips with urination.). Negative for frequency and hematuria.  Musculoskeletal: Positive for back pain.  Neurological: Positive for tingling (Numbness and tingling in legs bilaterally.). Negative for headaches.      Physical Exam   Blood pressure 117/70, pulse 91, temperature 98 F (36.7 C), temperature source Oral, resp. rate 18, last menstrual period 05/05/2015, currently breastfeeding.  Physical Exam  Constitutional: She is oriented to person, place, and time. She appears well-developed and well-nourished.  HENT:  Head: Normocephalic and atraumatic.  Neck: Normal range of motion.  Respiratory: Effort normal.  GI: Soft. There is tenderness (Mild tenderness in lower abdomen to palpation.).  Genitourinary: Cervix exhibits no motion tenderness. No bleeding in the vagina.  Cervix is closed and thick.  Musculoskeletal: Normal range of motion.  Neurological: She is alert and oriented to person, place, and time.  Skin: Skin is warm and dry.  Psychiatric: She has a normal mood and affect. Her behavior is normal. Thought content normal.    Results for orders placed or performed during the hospital encounter of 09/20/15 (from the past 24 hour(s))  Urinalysis, Routine w reflex microscopic (not at [facility])     Status: Abnormal   Collection Time: 09/20/15  2:35 PM  Result Value Ref Range   Color, Urine YELLOW YELLOW   APPearance  CLEAR CLEAR   Specific Gravity, Urine 1.020 1.005 - 1.030   pH 6.5 5.0 - 8.0   Glucose, UA NEGATIVE NEGATIVE mg/dL   Hgb urine dipstick NEGATIVE NEGATIVE   Bilirubin Urine NEGATIVE NEGATIVE   Ketones, ur NEGATIVE NEGATIVE mg/dL   Protein, ur NEGATIVE NEGATIVE mg/dL   Nitrite NEGATIVE NEGATIVE   Leukocytes, UA TRACE (A) NEGATIVE  Urine microscopic-add on     Status: Abnormal   Collection Time: 09/20/15  2:35 PM  Result Value Ref Range    Squamous Epithelial / LPF 0-5 (A) NONE SEEN   WBC, UA 0-5 0 - 5 WBC/hpf   RBC / HPF NONE SEEN 0 - 5 RBC/hpf   Bacteria, UA FEW (A) NONE SEEN   Urine-Other MUCOUS PRESENT   Wet prep, genital     Status: Abnormal   Collection Time: 09/20/15  3:18 PM  Result Value Ref Range   Yeast Wet Prep HPF POC NONE SEEN NONE SEEN   Trich, Wet Prep NONE SEEN NONE SEEN   Clue Cells Wet Prep HPF POC NONE SEEN NONE SEEN   WBC, Wet Prep HPF POC MODERATE (A) NONE SEEN   Sperm NONE SEEN    MAU Course  Procedures  MDM  Assessment and Plan  1. Round ligament pain  A. Discussed use of abdominal binder, ice packs and Tylenol for pain relief.  B. Prescribed Flexeril for back pain.  Emma-Lee Oddo E [place] 09/20/2015, 3:25 PM

## 2015-09-20 NOTE — MAU Note (Addendum)
Feeling too much pain.  Low back, around to stomach and down into legs.  Increase in pain. Worse when she stands

## 2015-09-20 NOTE — MAU Note (Signed)
C/o pain in lower back and lower abdomen, both hips and both legs since Thursday;

## 2015-09-21 LAB — URINE CULTURE

## 2015-09-21 LAB — GC/CHLAMYDIA PROBE AMP (~~LOC~~) NOT AT ARMC
Chlamydia: NEGATIVE
Neisseria Gonorrhea: NEGATIVE

## 2015-10-06 ENCOUNTER — Telehealth: Payer: Self-pay | Admitting: *Deleted

## 2015-10-06 NOTE — Telephone Encounter (Signed)
Patient has a cold and wants to know what she can take. Called patient back- OTC medication list reviewed.

## 2015-10-15 ENCOUNTER — Ambulatory Visit (INDEPENDENT_AMBULATORY_CARE_PROVIDER_SITE_OTHER): Payer: Medicaid Other | Admitting: Certified Nurse Midwife

## 2015-10-15 ENCOUNTER — Ambulatory Visit (INDEPENDENT_AMBULATORY_CARE_PROVIDER_SITE_OTHER): Payer: Medicaid Other

## 2015-10-15 VITALS — BP 105/70 | HR 79 | Temp 98.5°F | Wt 217.8 lb

## 2015-10-15 DIAGNOSIS — Z3482 Encounter for supervision of other normal pregnancy, second trimester: Secondary | ICD-10-CM

## 2015-10-15 DIAGNOSIS — Z3689 Encounter for other specified antenatal screening: Secondary | ICD-10-CM

## 2015-10-15 DIAGNOSIS — Z3492 Encounter for supervision of normal pregnancy, unspecified, second trimester: Secondary | ICD-10-CM | POA: Diagnosis not present

## 2015-10-15 DIAGNOSIS — Z1389 Encounter for screening for other disorder: Secondary | ICD-10-CM

## 2015-10-15 DIAGNOSIS — Z36 Encounter for antenatal screening of mother: Secondary | ICD-10-CM | POA: Diagnosis not present

## 2015-10-15 DIAGNOSIS — Z331 Pregnant state, incidental: Secondary | ICD-10-CM

## 2015-10-15 LAB — POCT URINALYSIS DIPSTICK
BILIRUBIN UA: NEGATIVE
Blood, UA: NEGATIVE
Glucose, UA: NEGATIVE
KETONES UA: NEGATIVE
LEUKOCYTES UA: NEGATIVE
Nitrite, UA: NEGATIVE
PH UA: 7
Protein, UA: NEGATIVE
Spec Grav, UA: <=1.005
Urobilinogen, UA: NEGATIVE

## 2015-10-15 NOTE — Progress Notes (Signed)
Patient states she has back pain and pain in her L hip. Patient states she has 2-3 contractions/day- she states that is normal for her

## 2015-10-15 NOTE — Progress Notes (Signed)
Subjective:    Ashley [name] is a 30 y.o. female being seen today for her obstetrical visit. She is at 3935w5d gestation. Patient reports: no complaints . Fetal movement: normal.  Problem List Items Addressed This Visit    None    Visit Diagnoses    Prenatal care, second trimester    -  Primary   Relevant Orders   POCT urinalysis dipstick (Completed)     Patient Active Problem List   Diagnosis Date Noted  . Active labor at term 06/21/2013  . Normal delivery 06/21/2013  . Supervision of other normal pregnancy 11/15/2012   Objective:    BP 105/70   Pulse 79   Temp 98.5 F (36.9 C)   Wt 217 lb 12.8 oz (98.8 kg)   LMP 05/05/2015 (LMP Unknown)   BMI 34.11 kg/m  FHT: 145 BPM  Uterine Size: 20 cm cm and size equals dates     Assessment:    Pregnancy @ 8735w5d    Doing well   Plan:    Anatomy scan was today  Declined genetic screening today  OBGCT: discussed. Signs and symptoms of preterm labor: discussed.  Labs, problem list reviewed and updated 2 hr GTT planned Follow up in 4 weeks.

## 2015-11-12 ENCOUNTER — Encounter: Payer: Medicaid Other | Admitting: Certified Nurse Midwife

## 2015-11-13 ENCOUNTER — Ambulatory Visit (INDEPENDENT_AMBULATORY_CARE_PROVIDER_SITE_OTHER): Payer: Medicaid Other | Admitting: Certified Nurse Midwife

## 2015-11-13 VITALS — BP 101/68 | HR 93 | Wt 221.0 lb

## 2015-11-13 DIAGNOSIS — Z3482 Encounter for supervision of other normal pregnancy, second trimester: Secondary | ICD-10-CM

## 2015-11-13 DIAGNOSIS — K219 Gastro-esophageal reflux disease without esophagitis: Secondary | ICD-10-CM | POA: Diagnosis not present

## 2015-11-13 MED ORDER — OMEPRAZOLE 20 MG PO CPDR: 20.0000 mg | DELAYED_RELEASE_CAPSULE | Freq: Two times a day (BID) | ORAL | 5 refills | Status: DC

## 2015-11-13 NOTE — Progress Notes (Signed)
Subjective:    Ashley DickensMitray [name] is a 30 y.o. female being seen today for her obstetrical visit. She is at 6434w6d gestation. Patient reports: heartburn, no bleeding, no contractions, no cramping, no leaking and flatulance, especially at night.  Mylicon OTC encouraged.  . Fetal movement: normal.  Problem List Items Addressed This Visit      Digestive   GERD (gastroesophageal reflux disease) - Primary   Relevant Medications   omeprazole (PRILOSEC) 20 MG capsule    Other Visit Diagnoses    Supervision of other normal pregnancy, antepartum, second trimester         Patient Active Problem List   Diagnosis Date Noted  . GERD (gastroesophageal reflux disease) 11/13/2015  . Active labor at term 06/21/2013  . Normal delivery 06/21/2013  . Supervision of other normal pregnancy 11/15/2012   Objective:    BP 101/68   Pulse 93   Wt 221 lb (100.2 kg)   LMP 05/05/2015 (LMP Unknown)   BMI 34.61 kg/m  FHT: 136 BPM  Uterine Size: 24 cm and size equals dates     Assessment:    Pregnancy @ 3434w6d    GERD: Prilosec started  Flatulence  Plan:    OBGCT: discussed and ordered for next visit. Signs and symptoms of preterm labor: discussed.  Labs, problem list reviewed and updated 2 hr GTT planned Follow up in 4 weeks.

## 2015-11-13 NOTE — Patient Instructions (Addendum)
Gastroesophageal Reflux Disease, Adult Normally, food travels down the esophagus and stays in the stomach to be digested. However, when a person has gastroesophageal reflux disease (GERD), food and stomach acid move back up into the esophagus. When this happens, the esophagus becomes sore and inflamed. Over time, GERD can create small holes (ulcers) in the lining of the esophagus.  CAUSES This condition is caused by a problem with the muscle between the esophagus and the stomach (lower esophageal sphincter, or LES). Normally, the LES muscle closes after food passes through the esophagus to the stomach. When the LES is weakened or abnormal, it does not close properly, and that allows food and stomach acid to go back up into the esophagus. The LES can be weakened by certain dietary substances, medicines, and medical conditions, including:  Tobacco use.  Pregnancy.  Having a hiatal hernia.  Heavy alcohol use.  Certain foods and beverages, such as coffee, chocolate, onions, and peppermint. RISK FACTORS This condition is more likely to develop in:  People who have an increased body weight.  People who have connective tissue disorders.  People who use NSAID medicines. SYMPTOMS Symptoms of this condition include:  Heartburn.  Difficult or painful swallowing.  The feeling of having a lump in the throat.  Abitter taste in the mouth.  Bad breath.  Having a large amount of saliva.  Having an upset or bloated stomach.  Belching.  Chest pain.  Shortness of breath or wheezing.  Ongoing (chronic) cough or a night-time cough.  Wearing away of tooth enamel.  Weight loss. Different conditions can cause chest pain. Make sure to see your health care provider if you experience chest pain. DIAGNOSIS Your health care provider will take a medical history and perform a physical exam. To determine if you have mild or severe GERD, your health care provider may also monitor how you respond  to treatment. You may also have other tests, including:  An endoscopy toexamine your stomach and esophagus with a small camera.  A test thatmeasures the acidity level in your esophagus.  A test thatmeasures how much pressure is on your esophagus.  A barium swallow or modified barium swallow to show the shape, size, and functioning of your esophagus. TREATMENT The goal of treatment is to help relieve your symptoms and to prevent complications. Treatment for this condition may vary depending on how severe your symptoms are. Your health care provider may recommend:  Changes to your diet.  Medicine.  Surgery. HOME CARE INSTRUCTIONS Diet  Follow a diet as recommended by your health care provider. This may involve avoiding foods and drinks such as:  Coffee and tea (with or without caffeine).  Drinks that containalcohol.  Energy drinks and sports drinks.  Carbonated drinks or sodas.  Chocolate and cocoa.  Peppermint and mint flavorings.  Garlic and onions.  Horseradish.  Spicy and acidic foods, including peppers, chili powder, curry powder, vinegar, hot sauces, and barbecue sauce.  Citrus fruit juices and citrus fruits, such as oranges, lemons, and limes.  Tomato-based foods, such as red sauce, chili, salsa, and pizza with red sauce.  Fried and fatty foods, such as donuts, french fries, potato chips, and high-fat dressings.  High-fat meats, such as hot dogs and fatty cuts of red and white meats, such as rib eye steak, sausage, ham, and bacon.  High-fat dairy items, such as whole milk, butter, and cream cheese.  Eat small, frequent meals instead of large meals.  Avoid drinking large amounts of liquid with your   meals.  Avoid eating meals during the 2-3 hours before bedtime.  Avoid lying down right after you eat.  Do not exercise right after you eat. General Instructions  Pay attention to any changes in your symptoms.  Take over-the-counter and prescription  medicines only as told by your health care provider. Do not take aspirin, ibuprofen, or other NSAIDs unless your health care provider told you to do so.  Do not use any tobacco products, including cigarettes, chewing tobacco, and e-cigarettes. If you need help quitting, ask your health care provider.  Wear loose-fitting clothing. Do not wear anything tight around your waist that causes pressure on your abdomen.  Raise (elevate) the head of your bed 6 inches (15cm).  Try to reduce your stress, such as with yoga or meditation. If you need help reducing stress, ask your health care provider.  If you are overweight, reduce your weight to an amount that is healthy for you. Ask your health care provider for guidance about a safe weight loss goal.  Keep all follow-up visits as told by your health care provider. This is important. SEEK MEDICAL CARE IF:  You have new symptoms.  You have unexplained weight loss.  You have difficulty swallowing, or it hurts to swallow.  You have wheezing or a persistent cough.  Your symptoms do not improve with treatment.  You have a hoarse voice. SEEK IMMEDIATE MEDICAL CARE IF:  You have pain in your arms, neck, jaw, teeth, or back.  You feel sweaty, dizzy, or light-headed.  You have chest pain or shortness of breath.  You vomit and your vomit looks like blood or coffee grounds.  You faint.  Your stool is bloody or black.  You cannot swallow, drink, or eat.   This information is not intended to replace advice given to you by your health care provider. Make sure you discuss any questions you have with your health care provider.   Document Released: 12/01/2004 Document Revised: 11/12/2014 Document Reviewed: 06/18/2014 Elsevier Interactive Patient Education 2016 Else[employer] Inc[employer]  Heartburn During Pregnancy Heartburn is a burning sensation in the chest caused by stomach acid backing up into the esophagus. Heartburn is common in pregnancy because a  certain hormone (progesterone) is released when a woman is pregnant. The progesterone hormone may relax the valve that separates the esophagus from the stomach. This allows acid to go up into the esophagus, causing heartburn. Heartburn may also happen in pregnancy because the enlarging uterus pushes up on the stomach, which pushes more acid into the esophagus. This is especially true in the later stages of pregnancy. Heartburn problems usually go away after giving birth. CAUSES  Heartburn is caused by stomach acid backing up into the esophagus. During pregnancy, this may result from various things, including:   The progesterone hormone.  Changing hormone levels.  The growing uterus pushing stomach acid upward.  Large meals.  Certain foods and drinks.  Exercise.  Increased acid production. SIGNS AND SYMPTOMS   Burning pain in the chest or lower throat.  Bitter taste in the mouth.  Coughing. DIAGNOSIS  Your health care provider will typically diagnose heartburn by taking a careful history of your concern. Blood tests may be done to check for a certain type of bacteria that is associated with heartburn. Sometimes, heartburn is diagnosed by prescribing a heartburn medicine to see if the symptoms improve. In some cases, a procedure called an endoscopy may be done. In this procedure, a tube with a light and a camera on  the end (endoscope) is used to examine the esophagus and the stomach. TREATMENT  Treatment will vary depending on the severity of your symptoms. Your health care provider may recommend:  Over-the-counter medicines (antacids, acid reducers) for mild heartburn.  Prescription medicines to decrease stomach acid or to protect your stomach lining.  Certain changes in your diet.  Elevating the head of your bed by putting blocks under the legs. This helps prevent stomach acid from backing up into the esophagus when you are lying down. HOME CARE INSTRUCTIONS   Only take  over-the-counter or prescription medicines as directed by your health care provider.  Raise the head of your bed by putting blocks under the legs if instructed to do so by your health care provider. Sleeping with more pillows is not effective because it only changes the position of your head.  Do not exercise right after eating.  Avoid eating 2-3 hours before bed. Do not lie down right after eating.  Eat small meals throughout the day instead of three large meals.  Identify foods and beverages that make your symptoms worse and avoid them. Foods you may want to avoid include:  Peppers.  Chocolate.  High-fat foods, including fried foods.  Spicy foods.  Garlic and onions.  Citrus fruits, including oranges, grapefruit, lemons, and limes.  Food containing tomatoes or tomato products.  Mint.  Carbonated and caffeinated drinks.  Vinegar. SEEK MEDICAL CARE IF:  You have abdominal pain of any kind.  You feel burning in your upper abdomen or chest, especially after eating or lying down.  You have nausea and vomiting.  Your stomach feels upset after you eat. SEEK IMMEDIATE MEDICAL CARE IF:   You have severe chest pain that goes down your arm or into your jaw or neck.  You feel sweaty, dizzy, or light-headed.  You become short of breath.  You vomit blood.  You have difficulty or pain with swallowing.  You have bloody or black, tarry stools.  You have episodes of heartburn more than 3 times a week, for more than 2 weeks. MAKE SURE YOU:  Understand these instructions.  Will watch your condition.  Will get help right away if you are not doing well or get worse.   This information is not intended to replace advice given to you by your health care provider. Make sure you discuss any questions you have with your health care provider.   Document Released: 02/19/2000 Document Revised: 03/14/2014 Document Reviewed: 10/10/2012 Elsevier Interactive Patient Education 2016  [employer].  Second Trimester of Pregnancy The second trimester is from week 13 through week 28, month 4 through 6. This is often the time in pregnancy that you feel your best. Often times, morning sickness has lessened or quit. You may have more energy, and you may get hungry more often. Your unborn baby (fetus) is growing rapidly. At the end of the sixth month, he or she is about 9 inches long and weighs about 1 pounds. You will likely feel the baby move (quickening) between 18 and 20 weeks of pregnancy. HOME CARE   Avoid all smoking, herbs, and alcohol. Avoid drugs not approved by your doctor.  Do not use any tobacco products, including cigarettes, chewing tobacco, and electronic cigarettes. If you need help quitting, ask your doctor. You may get counseling or other support to help you quit.  Only take medicine as told by your doctor. Some medicines are safe and some are not during pregnancy.  Exercise only as told by  your doctor. Stop exercising if you start having cramps.  Eat regular, healthy meals.  Wear a good support bra if your breasts are tender.  Do not use hot tubs, steam rooms, or saunas.  Wear your seat belt when driving.  Avoid raw meat, uncooked cheese, and liter boxes and soil used by cats.  Take your prenatal vitamins.  Take 1500-2000 milligrams of calcium daily starting at the 20th week of pregnancy until you deliver your baby.  Try taking medicine that helps you poop (stool softener) as needed, and if your doctor approves. Eat more fiber by eating fresh fruit, vegetables, and whole grains. Drink enough fluids to keep your pee (urine) clear or pale yellow.  Take warm water baths (sitz baths) to soothe pain or discomfort caused by hemorrhoids. Use hemorrhoid cream if your doctor approves.  If you have puffy, bulging veins (varicose veins), wear support hose. Raise (elevate) your feet for 15 minutes, 3-4 times a day. Limit salt in your diet.  Avoid heavy  lifting, wear low heals, and sit up straight.  Rest with your legs raised if you have leg cramps or low back pain.  Visit your dentist if you have not gone during your pregnancy. Use a soft toothbrush to brush your teeth. Be gentle when you floss.  You can have sex (intercourse) unless your doctor tells you not to.  Go to your doctor visits. GET HELP IF:   You feel dizzy.  You have mild cramps or pressure in your lower belly (abdomen).  You have a nagging pain in your belly area.  You continue to feel sick to your stomach (nauseous), throw up (vomit), or have watery poop (diarrhea).  You have bad smelling fluid coming from your vagina.  You have pain with peeing (urination). GET HELP RIGHT AWAY IF:   You have a fever.  You are leaking fluid from your vagina.  You have spotting or bleeding from your vagina.  You have severe belly cramping or pain.  You lose or gain weight rapidly.  You have trouble catching your breath and have chest pain.  You notice sudden or extreme puffiness (swelling) of your face, hands, ankles, feet, or legs.  You have not felt the baby move in over an hour.  You have severe headaches that do not go away with medicine.  You have vision changes.   This information is not intended to replace advice given to you by your health care provider. Make sure you discuss any questions you have with your health care provider.   Document Released: 05/18/2009 Document Revised: 03/14/2014 Document Reviewed: 04/24/2012 Elsevier Interactive Patient Education [employer]2016 Elsevier Inc.

## 2015-12-11 ENCOUNTER — Other Ambulatory Visit: Payer: Medicaid Other

## 2015-12-11 ENCOUNTER — Ambulatory Visit (INDEPENDENT_AMBULATORY_CARE_PROVIDER_SITE_OTHER): Payer: Medicaid Other | Admitting: Certified Nurse Midwife

## 2015-12-11 VITALS — BP 100/69 | HR 93 | Temp 98.3°F | Wt 223.0 lb

## 2015-12-11 DIAGNOSIS — Z3482 Encounter for supervision of other normal pregnancy, second trimester: Secondary | ICD-10-CM

## 2015-12-11 NOTE — Progress Notes (Signed)
Subjective:    Ashley [name] is a 30 y.o. female being seen today for her obstetrical visit. She is at 2516w6d gestation. Patient reports: no complaints . Fetal movement: normal.  Problem List Items Addressed This Visit    None    Visit Diagnoses    Encounter for supervision of other normal pregnancy in second trimester    -  Primary   Relevant Orders   Glucose Tolerance, 2 Hours w/1 Hour   CBC   HIV antibody   RPR     Patient Active Problem List   Diagnosis Date Noted  . GERD (gastroesophageal reflux disease) 11/13/2015  . Active labor at term 06/21/2013  . Normal delivery 06/21/2013  . Supervision of other normal pregnancy 11/15/2012   Objective:    BP 100/69   Pulse 93   Temp 98.3 F (36.8 C)   Wt 223 lb (101.2 kg)   LMP 05/05/2015 (LMP Unknown)   BMI 34.93 kg/m  FHT: 133 BPM  Uterine Size: 27 cm and size equals dates     Assessment:    Pregnancy @ 5716w6d    doing well  Plan:    OBGCT: discussed and ordered. Signs and symptoms of preterm labor: discussed and handout given.  Labs, problem list reviewed and updated 2 hr GTT today. Follow up in 2 weeks.

## 2015-12-11 NOTE — Progress Notes (Signed)
Patient is in office and states that she feels uterine irritability every other day with mild pressure but no bleeding. Patient reports good fetal movement and nausea throughout the day.

## 2015-12-12 LAB — GLUCOSE TOLERANCE, 2 HOURS W/ 1HR
GLUCOSE, 2 HOUR: 98 mg/dL (ref 65–152)
Glucose, 1 hour: 139 mg/dL (ref 65–179)
Glucose, Fasting: 79 mg/dL (ref 65–91)

## 2015-12-12 LAB — CBC
HEMATOCRIT: 36.9 % (ref 34.0–46.6)
Hemoglobin: 12.5 g/dL (ref 11.1–15.9)
MCH: 29.1 pg (ref 26.6–33.0)
MCHC: 33.9 g/dL (ref 31.5–35.7)
MCV: 86 fL (ref 79–97)
Platelets: 208 10*3/uL (ref 150–379)
RBC: 4.3 x10E6/uL (ref 3.77–5.28)
RDW: 13.7 % (ref 12.3–15.4)
WBC: 10.7 10*3/uL (ref 3.4–10.8)

## 2015-12-12 LAB — RPR: RPR: NONREACTIVE

## 2015-12-12 LAB — HIV ANTIBODY (ROUTINE TESTING W REFLEX): HIV Screen 4th Generation wRfx: NONREACTIVE

## 2015-12-14 ENCOUNTER — Other Ambulatory Visit: Payer: Self-pay | Admitting: Certified Nurse Midwife

## 2015-12-14 DIAGNOSIS — Z3483 Encounter for supervision of other normal pregnancy, third trimester: Secondary | ICD-10-CM

## 2015-12-25 ENCOUNTER — Ambulatory Visit (INDEPENDENT_AMBULATORY_CARE_PROVIDER_SITE_OTHER): Payer: Medicaid Other | Admitting: Obstetrics

## 2015-12-25 ENCOUNTER — Encounter: Payer: Self-pay | Admitting: Obstetrics

## 2015-12-25 VITALS — BP 110/71 | HR 91 | Temp 98.7°F | Wt 223.7 lb

## 2015-12-25 DIAGNOSIS — Z3493 Encounter for supervision of normal pregnancy, unspecified, third trimester: Secondary | ICD-10-CM

## 2015-12-25 NOTE — Progress Notes (Signed)
Patient c/o lower pelvic pain for the past week or so.

## 2015-12-25 NOTE — Progress Notes (Signed)
Subjective:    Ashley [name] is a 30 y.o. female being seen today for her obstetrical visit. She is at 3244w6d gestation. Patient reports no complaints. Fetal movement: normal.  Problem List Items Addressed This Visit    None    Visit Diagnoses    Supervision of low-risk pregnancy, third trimester    -  Primary     Patient Active Problem List   Diagnosis Date Noted  . GERD (gastroesophageal reflux disease) 11/13/2015  . Active labor at term 06/21/2013  . Normal delivery 06/21/2013  . Encounter for supervision of other normal pregnancy 11/15/2012   Objective:    BP 110/71   Pulse 91   Temp 98.7 F (37.1 C)   Wt 223 lb 11.2 oz (101.5 kg)   LMP 05/05/2015 (LMP Unknown)   BMI 35.04 kg/m  FHT:  150 BPM  Uterine Size: size equals dates  Presentation: unsure     Assessment:    Pregnancy @ 9044w6d weeks   Plan:     labs reviewed, problem list updated Consent signed. GBS sent TDAP offered  Rhogam given for RH negative Pediatrician: discussed. Infant feeding: plans to breastfeed. Maternity leave: discussed. Cigarette smoking: never smoked. No orders of the defined types were placed in this encounter.  No orders of the defined types were placed in this encounter.  Follow up in 2 Weeks.

## 2016-01-07 ENCOUNTER — Encounter: Payer: Medicaid Other | Admitting: Obstetrics

## 2016-01-07 ENCOUNTER — Ambulatory Visit (INDEPENDENT_AMBULATORY_CARE_PROVIDER_SITE_OTHER): Payer: Medicaid Other | Admitting: Obstetrics

## 2016-01-07 ENCOUNTER — Encounter: Payer: Self-pay | Admitting: Obstetrics

## 2016-01-07 VITALS — BP 110/70 | HR 96 | Temp 98.4°F | Wt 224.2 lb

## 2016-01-07 DIAGNOSIS — Z3493 Encounter for supervision of normal pregnancy, unspecified, third trimester: Secondary | ICD-10-CM

## 2016-01-07 DIAGNOSIS — K219 Gastro-esophageal reflux disease without esophagitis: Secondary | ICD-10-CM

## 2016-01-07 MED ORDER — OB COMPLETE PETITE 35-5-1-200 MG PO CAPS: 1.0000 | ORAL_CAPSULE | Freq: Every day | ORAL | 3 refills | Status: DC

## 2016-01-07 MED ORDER — OMEPRAZOLE 20 MG PO CPDR: 20.0000 mg | DELAYED_RELEASE_CAPSULE | Freq: Two times a day (BID) | ORAL | 5 refills | Status: DC

## 2016-01-07 NOTE — Addendum Note (Signed)
Addended by: Coral CeoHARPER, Girtie Wiersma A on: 01/07/2016 03:38 PM   Modules accepted: Orders, SmartSet

## 2016-01-07 NOTE — Progress Notes (Signed)
Subjective:    Ashley [name] is a 30 y.o. female being seen today for her obstetrical visit. She is at 4559w5d gestation. Patient reports heartburn. Fetal movement: normal.  Problem List Items Addressed This Visit    None    Visit Diagnoses   None.    Patient Active Problem List   Diagnosis Date Noted  . GERD (gastroesophageal reflux disease) 11/13/2015  . Active labor at term 06/21/2013  . Normal delivery 06/21/2013  . Encounter for supervision of other normal pregnancy 11/15/2012   Objective:    BP 110/70   Pulse 96   Temp 98.4 F (36.9 C)   Wt 224 lb 3.2 oz (101.7 kg)   LMP 05/05/2015 (LMP Unknown)   BMI 35.11 kg/m  FHT:  150 BPM  Uterine Size: size equals dates  Presentation: unsure     Assessment:    Pregnancy @ 5859w5d weeks   Plan:     labs reviewed, problem list updated Consent signed. GBS sent TDAP offered  Rhogam given for RH negative Pediatrician: discussed. Infant feeding: plans to breastfeed. Maternity leave: discussed. Cigarette smoking: never smoked. No orders of the defined types were placed in this encounter.  No orders of the defined types were placed in this encounter.  Follow up in 2 Weeks.

## 2016-01-15 ENCOUNTER — Inpatient Hospital Stay (HOSPITAL_COMMUNITY)
Admission: AD | Admit: 2016-01-15 | Discharge: 2016-01-15 | Disposition: A | Discharge status: Home / Self Care | Payer: Medicaid Other | Source: Ambulatory Visit | Attending: Obstetrics & Gynecology | Admitting: Obstetrics & Gynecology

## 2016-01-15 ENCOUNTER — Encounter (HOSPITAL_COMMUNITY): Payer: Self-pay

## 2016-01-15 DIAGNOSIS — G44209 Tension-type headache, unspecified, not intractable: Secondary | ICD-10-CM | POA: Insufficient documentation

## 2016-01-15 DIAGNOSIS — G44219 Episodic tension-type headache, not intractable: Secondary | ICD-10-CM

## 2016-01-15 DIAGNOSIS — O26893 Other specified pregnancy related conditions, third trimester: Secondary | ICD-10-CM | POA: Insufficient documentation

## 2016-01-15 DIAGNOSIS — Z3A32 32 weeks gestation of pregnancy: Secondary | ICD-10-CM | POA: Insufficient documentation

## 2016-01-15 DIAGNOSIS — R51 Headache: Secondary | ICD-10-CM | POA: Diagnosis present

## 2016-01-15 MED ORDER — LACTATED RINGERS IV BOLUS (SEPSIS)
1000.0000 mL | Freq: Once | INTRAVENOUS | Status: AC
  Administered 2016-01-15: 1000 mL via INTRAVENOUS

## 2016-01-15 MED ORDER — DEXAMETHASONE SODIUM PHOSPHATE 10 MG/ML IJ SOLN
10.0000 mg | Freq: Once | INTRAMUSCULAR | Status: AC
  Administered 2016-01-15: 10 mg via INTRAVENOUS
  Filled 2016-01-15: qty 1

## 2016-01-15 MED ORDER — METOCLOPRAMIDE HCL 5 MG/ML IJ SOLN
10.0000 mg | Freq: Once | INTRAMUSCULAR | Status: AC
Start: 2016-01-15 — End: 2016-01-15
  Administered 2016-01-15: 10 mg via INTRAVENOUS
  Filled 2016-01-15: qty 2

## 2016-01-15 MED ORDER — BUTALBITAL-APAP-CAFFEINE 50-325-40 MG PO TABS: 1.0000 | ORAL_TABLET | Freq: Four times a day (QID) | ORAL | 0 refills | Status: DC | PRN

## 2016-01-15 MED ORDER — DIPHENHYDRAMINE HCL 50 MG/ML IJ SOLN
25.0000 mg | Freq: Once | INTRAMUSCULAR | Status: AC
  Administered 2016-01-15: 25 mg via INTRAVENOUS
  Filled 2016-01-15: qty 1

## 2016-01-15 NOTE — MAU Note (Signed)
Pain in R ear for a week. Burns, itches and hurts. Pain down R neck, shoulder and arm. Also headache. Achy, sore pain in neck and shoulder. "Pulling" pain. Sometimes feels "numb" in shoulder. Constipation.

## 2016-01-15 NOTE — MAU Provider Note (Signed)
None     Chief Complaint:  Headache  Ashley [name] is  30 y.o. [phone] at 6751w6d presents complaining of HA on top of head which radiates down to left side of head/ear/neck/shoulders/arm.  No vision changes, no weakness.  Has had this HA in the past, has not had treatment.  .    Obstetrical/Gynecological History: OB History    Gravida Para Term Preterm AB Living   4 2 2   1 2    SAB TAB Ectopic Multiple Live Births   1       2     Past Medical History: Past Medical History:  Diagnosis Date  . Asthma     Past Surgical History: Past Surgical History:  Procedure Laterality Date  . NO PAST SURGERIES      Family History: History reviewed. No pertinent family history.  Social History: Social History  Substance Use Topics  . Smoking status: Never Smoker  . Smokeless tobacco: Never Used  . Alcohol use No    Allergies: No Known Allergies  Meds:  Prescriptions Prior to Admission  Medication Sig Dispense Refill Last Dose  . acetaminophen (TYLENOL) 500 MG tablet Take 1,000 mg by mouth every 6 (six) hours as needed for mild pain or headache.   Past Week at Unknown time  . cyclobenzaprine (FLEXERIL) 10 MG tablet Take 1 tablet (10 mg total) by mouth 3 (three) times daily as needed for muscle spasms. 20 tablet 0 months at Unknown time  . Prenat-FeCbn-FeAspGl-FA-Omega (OB COMPLETE PETITE) 35-5-1-200 MG CAPS Take 1 capsule by mouth daily before breakfast. 90 capsule 3 01/15/2016 at Unknown time  . omeprazole (PRILOSEC) 20 MG capsule Take 1 capsule (20 mg total) by mouth 2 (two) times daily before a meal. (Patient not taking: Reported on 01/15/2016) 60 capsule 5 Not Taking at Unknown time    Review of Systems   Constitutional: Negative for fever and chills Eyes: Negative for visual disturbances Respiratory: Negative for shortness of breath, dyspnea Cardiovascular: Negative for chest pain or palpitations  Gastrointestinal: Negative for vomiting, diarrhea and  constipation Genitourinary: Negative for dysuria and urgency Musculoskeletal: Negative for back pain, joint pain, myalgias.  Normal ROM.  Strength/grip equal bilaterally Neurological: Negative for dizziness     Physical Exam  Blood pressure 120/72, pulse 94, temperature 98.2 F (36.8 C), resp. rate 18, height 5\' 7"  (1.702 m), weight 101.7 kg (224 lb 3.2 oz), last menstrual period 05/05/2015, SpO2 98 %, currently breastfeeding. GENERAL: Well-developed, well-nourished female in no acute distress.  LUNGS: Clear to auscultation bilaterally.  HEART: Regular rate and rhythm. ABDOMEN: Soft, nontender, nondistended, gravid.  EXTREMITIES: Nontender, no edema, 2+ distal pulses. DTR's 2+ FHT:  Baseline rate 145 bpm   Variability moderate  Accelerations present   Decelerations none Contractions: Every 0 mins   Labs: No results found for this or any previous visit (from the past 24 hour(s)). Imaging Studies:  No results found.  Assessment: Ashley [name] is  30 y.o. [phone] at 6251w6d presents with HA, tension type.  Responded well to HA cocktail.  Plan: DC home. Rx fioricet for prn use  CRESENZO-DISHMAN,Keshara Kiger 11/10/201710:47 PM

## 2016-01-15 NOTE — MAU Note (Signed)
Urine in lab 

## 2016-01-15 NOTE — MAU Note (Signed)
Patient presents with constant pain in the right side of her head of 9/10, radiating all the way down her arm.  States feeling numbness and tingling down her arm.  Has taken 2 tylenol.  Reports having a similar pain during a previous pregnancy but has not experienced it since.  States yesterday she had an episode where she stood up she felt hot and lightheaded.  Reports no issues with this pregnancy other than continued morning sickness.  Good fetal movement, no gushes of fluid or vaginal discharge.  Reports feeling contractions 3-4 times per day.

## 2016-01-15 NOTE — Progress Notes (Signed)
Notified of patient's arrival to MAU & complaints.  CNM to put in orders & come see patient.

## 2016-01-15 NOTE — Discharge Instructions (Signed)
For Headaches:   Stay well hydrated, drink enough water so that your urine is clear, sometimes if you are dehydrated you can get headaches  Eat small frequent meals and snacks, sometimes if you are hungry you can get headaches  Sometimes you get headaches during pregnancy from the pregnancy hormones  You can try tylenol (1-2 regular strength 325mg  or 1-2 extra strength 500mg ) as directed on the box. The least amount of medication that works is best.   Cool compresses (cool wet washcloth or ice pack) to area of head that is hurting  You can also try drinking a caffeinated drink to see if this will help  If not helping, try below:  For Prevention of Headaches/Migraines:  CoQ10 100mg  three times daily  Vitamin B2 400mg  daily  Magnesium Oxide 400-600mg  daily  If You Get a Bad Headache/Migraine:  Benadryl 25mg    Magnesium Oxide  1 large Gatorade  2 extra strength Tylenol (1,000mg  total)  1 cup coffee or Coke  If this doesn't help please call us @ [phone]     Tension Headache A tension headache is a feeling of pain, pressure, or aching that is often felt over the front and sides of the head. The pain can be dull, or it can feel tight (constricting). Tension headaches are not normally associated with nausea or vomiting, and they do not get worse with physical activity. Tension headaches can last from 30 minutes to several days. This is the most common type of headache. CAUSES The exact cause of this condition is not known. Tension headaches often begin after stress, anxiety, or depression. Other triggers may include:  Alcohol.  Too much caffeine, or caffeine withdrawal.  Respiratory infections, such as colds, flu, or sinus infections.  Dental problems or teeth clenching.  Fatigue.  Holding your head and neck in the same position for a long period of time, such as while using a computer.  Smoking. SYMPTOMS Symptoms of this condition include:  A feeling of  pressure around the head.  Dull, aching head pain.  Pain felt over the front and sides of the head.  Tenderness in the muscles of the head, neck, and shoulders. DIAGNOSIS This condition may be diagnosed based on your symptoms and a physical exam. Tests may be done, such as a CT scan or an MRI of your head. These tests may be done if your symptoms are severe or unusual. TREATMENT This condition may be treated with lifestyle changes and medicines to help relieve symptoms. HOME CARE INSTRUCTIONS Managing Pain  Take over-the-counter and prescription medicines only as told by your health care provider.  Lie down in a dark, quiet room when you have a headache.  If directed, apply ice to the head and neck area:  Put ice in a plastic bag.  Place a towel between your skin and the bag.  Leave the ice on for 20 minutes, 2-3 times per day.  Use a heating pad or a hot shower to apply heat to the head and neck area as told by your health care provider. Eating and Drinking  Eat meals on a regular schedule.  Limit alcohol use.  Decrease your caffeine intake, or stop using caffeine. General Instructions  Keep all follow-up visits as told by your health care provider. This is important.  Keep a headache journal to help find out what may trigger your headaches. For example, write down:  What you eat and drink.  How much sleep you get.  Any change to  your diet or medicines.  Try massage or other relaxation techniques.  Limit stress.  Sit up straight, and avoid tensing your muscles.  Do not use tobacco products, including cigarettes, chewing tobacco, or e-cigarettes. If you need help quitting, ask your health care provider.  Exercise regularly as told by your health care provider.  Get 7-9 hours of sleep, or the amount recommended by your health care provider. SEEK MEDICAL CARE IF:  Your symptoms are not helped by medicine.  You have a headache that is different from what you  normally experience.  You have nausea or you vomit.  You have a fever. SEEK IMMEDIATE MEDICAL CARE IF:  Your headache becomes severe.  You have repeated vomiting.  You have a stiff neck.  You have a loss of vision.  You have problems with speech.  You have pain in your eye or ear.  You have muscular weakness or loss of muscle control.  You lose your balance or you have trouble walking.  You feel faint or you pass out.  You have confusion.   This information is not intended to replace advice given to you by your health care provider. Make sure you discuss any questions you have with your health care provider.   Document Released: 02/21/2005 Document Revised: 11/12/2014 Document Reviewed: 06/16/2014 Elsevier Interactive Patient Education [employer]2016 Elsevier Inc.

## 2016-01-26 ENCOUNTER — Encounter: Payer: Medicaid Other | Admitting: Obstetrics

## 2016-02-10 ENCOUNTER — Ambulatory Visit (INDEPENDENT_AMBULATORY_CARE_PROVIDER_SITE_OTHER): Payer: Medicaid Other | Admitting: Obstetrics

## 2016-02-10 VITALS — BP 106/70 | HR 99 | Temp 98.5°F | Wt 229.1 lb

## 2016-02-10 DIAGNOSIS — Z3493 Encounter for supervision of normal pregnancy, unspecified, third trimester: Secondary | ICD-10-CM

## 2016-02-10 LAB — OB RESULTS CONSOLE GBS: GBS: NEGATIVE

## 2016-02-10 NOTE — Patient Instructions (Addendum)

## 2016-02-11 ENCOUNTER — Encounter: Payer: Self-pay | Admitting: Obstetrics

## 2016-02-11 NOTE — Progress Notes (Signed)
Subjective:    Ashley DickensMitray [name] is a 30 y.o. female being seen today for her obstetrical visit. She is at 5470w5d gestation. Patient reports no complaints. Fetal movement: normal.  Problem List Items Addressed This Visit    None    Visit Diagnoses    Encounter for supervision of low-risk pregnancy in third trimester    -  Primary   Relevant Orders   Strep Gp B Culture+Rflx     Patient Active Problem List   Diagnosis Date Noted  . GERD (gastroesophageal reflux disease) 11/13/2015  . Active labor at term 06/21/2013  . Normal delivery 06/21/2013  . Encounter for supervision of other normal pregnancy 11/15/2012   Objective:    BP 106/70   Pulse 99   Temp 98.5 F (36.9 C)   Wt 229 lb 1.6 oz (103.9 kg)   LMP 05/05/2015 (LMP Unknown)   BMI 35.88 kg/m  FHT:  150 BPM  Uterine Size: size equals dates  Presentation: unsure     Assessment:    Pregnancy @ 9270w5d weeks   Plan:     labs reviewed, problem list updated Consent signed. GBS sent TDAP offered  Rhogam given for RH negative Pediatrician: discussed. Infant feeding: plans to breastfeed. Maternity leave: discussed. Cigarette smoking: never smoked. Orders Placed This Encounter  Procedures  . Strep Gp B Culture+Rflx   No orders of the defined types were placed in this encounter.  Follow up in 1 Week.

## 2016-02-14 LAB — STREP GP B CULTURE+RFLX: Strep Gp B Culture+Rflx: NEGATIVE

## 2016-02-17 ENCOUNTER — Ambulatory Visit (INDEPENDENT_AMBULATORY_CARE_PROVIDER_SITE_OTHER): Payer: Medicaid Other | Admitting: Obstetrics

## 2016-02-17 ENCOUNTER — Encounter: Payer: Self-pay | Admitting: Obstetrics

## 2016-02-17 VITALS — BP 111/73 | HR 83 | Wt 227.8 lb

## 2016-02-17 DIAGNOSIS — Z3480 Encounter for supervision of other normal pregnancy, unspecified trimester: Secondary | ICD-10-CM

## 2016-02-17 DIAGNOSIS — Z3493 Encounter for supervision of normal pregnancy, unspecified, third trimester: Secondary | ICD-10-CM | POA: Insufficient documentation

## 2016-02-17 NOTE — Progress Notes (Signed)
Subjective:    Ashley [name] is a 30 y.o. female being seen today for her obstetrical visit. She is at 3373w4d gestation. Patient reports backache, occasional contractions and pelvic pressure. Fetal movement: normal.  Problem List Items Addressed This Visit    Encounter for supervision of low-risk pregnancy in third trimester - Primary     Patient Active Problem List   Diagnosis Date Noted  . Encounter for supervision of low-risk pregnancy in third trimester 02/17/2016  . GERD (gastroesophageal reflux disease) 11/13/2015  . Active labor at term 06/21/2013  . Normal delivery 06/21/2013  . Encounter for supervision of other normal pregnancy 11/15/2012    Objective:    BP 111/73   Pulse 83   Wt 227 lb 12.8 oz (103.3 kg)   LMP 05/05/2015 (LMP Unknown)   BMI 35.68 kg/m  FHT: 150 BPM  Uterine Size: size equals dates  Presentations: cephalic    Assessment:    Pregnancy @ 3973w4d weeks   Plan:   Plans for delivery: Vaginal anticipated; labs reviewed; problem list updated Counseling: Consent signed. Infant feeding: plans to breastfeed. Cigarette smoking: never smoked. L&D discussion: symptoms of labor, discussed when to call, discussed what number to call, anesthetic/analgesic options reviewed and delivering clinician:  plans no preference. Postpartum supports and preparation: circumcision discussed and contraception plans discussed.  Follow up in 1 Week.  Patient ID: Ashley DickensMitray Renz, female   DOB: 06/11/1985, 30 y.o.   MRN: [id number]

## 2016-02-24 ENCOUNTER — Encounter: Payer: Medicaid Other | Admitting: Obstetrics

## 2016-02-26 ENCOUNTER — Encounter: Payer: Self-pay | Admitting: Obstetrics

## 2016-02-26 ENCOUNTER — Ambulatory Visit (INDEPENDENT_AMBULATORY_CARE_PROVIDER_SITE_OTHER): Payer: Medicaid Other | Admitting: Obstetrics

## 2016-02-26 VITALS — BP 111/75 | HR 99 | Wt 227.0 lb

## 2016-02-26 DIAGNOSIS — Z3480 Encounter for supervision of other normal pregnancy, unspecified trimester: Secondary | ICD-10-CM

## 2016-02-26 DIAGNOSIS — Z3483 Encounter for supervision of other normal pregnancy, third trimester: Secondary | ICD-10-CM

## 2016-02-26 NOTE — Progress Notes (Signed)
Subjective:    Ashley [name] is a 30 y.o. female being seen today for her obstetrical visit. She is at 4127w6d gestation. Patient reports no complaints. Fetal movement: normal.  Problem List Items Addressed This Visit    Encounter for supervision of other normal pregnancy     Patient Active Problem List   Diagnosis Date Noted  . Encounter for supervision of low-risk pregnancy in third trimester 02/17/2016  . GERD (gastroesophageal reflux disease) 11/13/2015  . Active labor at term 06/21/2013  . Normal delivery 06/21/2013  . Encounter for supervision of other normal pregnancy 11/15/2012    Objective:    BP 111/75   Pulse 99   Wt 227 lb (103 kg)   LMP 05/05/2015 (LMP Unknown)   BMI 35.55 kg/m  FHT: 150 BPM  Uterine Size: size equals dates       Assessment:    Pregnancy @ 3327w6d weeks   Plan:   Plans for delivery: Vaginal anticipated; labs reviewed; problem list updated Counseling: Consent signed. Infant feeding: plans to breastfeed. Cigarette smoking: never smoked. L&D discussion: symptoms of labor, discussed when to call, discussed what number to call, anesthetic/analgesic options reviewed and delivering clinician:  plans no preference. Postpartum supports and preparation: circumcision discussed and contraception plans discussed.  Follow up in 1 Week.  Patient ID: Ashley [name], female   DOB: [birth year], 30 y.o.   MRN: [id number]

## 2016-03-03 ENCOUNTER — Encounter: Payer: Self-pay | Admitting: Obstetrics

## 2016-03-03 ENCOUNTER — Ambulatory Visit (INDEPENDENT_AMBULATORY_CARE_PROVIDER_SITE_OTHER): Payer: Medicaid Other | Admitting: Obstetrics

## 2016-03-03 VITALS — BP 127/70 | HR 91 | Wt 227.0 lb

## 2016-03-03 DIAGNOSIS — Z3483 Encounter for supervision of other normal pregnancy, third trimester: Secondary | ICD-10-CM

## 2016-03-03 DIAGNOSIS — Z3480 Encounter for supervision of other normal pregnancy, unspecified trimester: Secondary | ICD-10-CM

## 2016-03-03 NOTE — Progress Notes (Signed)
Subjective:    Ashley [name] is a 30 y.o. female being seen today for her obstetrical visit. She is at 7174w5d gestation. Patient reports occasional contractions. Fetal movement: normal.  Problem List Items Addressed This Visit    None     Patient Active Problem List   Diagnosis Date Noted  . Encounter for supervision of low-risk pregnancy in third trimester 02/17/2016  . GERD (gastroesophageal reflux disease) 11/13/2015  . Active labor at term 06/21/2013  . Normal delivery 06/21/2013  . Encounter for supervision of other normal pregnancy 11/15/2012    Objective:    BP 127/70   Pulse 91   Wt 227 lb (103 kg)   LMP 05/05/2015 (LMP Unknown)   BMI 35.55 kg/m  FHT: 150 BPM  Uterine Size: size equals dates  Presentations: cephalic    Assessment:    Pregnancy @ 5574w5d weeks   Plan:   Plans for delivery: Vaginal anticipated; labs reviewed; problem list updated Counseling: Consent signed. Infant feeding: plans to breastfeed. Cigarette smoking: never smoked. L&D discussion: symptoms of labor, discussed when to call, discussed what number to call, anesthetic/analgesic options reviewed and delivering clinician:  plans no preference. Postpartum supports and preparation: circumcision discussed and contraception plans discussed.  Follow up in 1 Week.  Patient ID: Ashley [name], female   DOB: 1985/03/24, 30 y.o.   MRN: [id number]

## 2016-03-06 ENCOUNTER — Inpatient Hospital Stay (HOSPITAL_COMMUNITY)
Admission: AD | Admit: 2016-03-06 | Discharge: 2016-03-08 | DRG: 775 | Disposition: A | Discharge status: Home / Self Care | Payer: Medicaid Other | Source: Ambulatory Visit | Attending: Obstetrics and Gynecology | Admitting: Obstetrics and Gynecology

## 2016-03-06 ENCOUNTER — Encounter (HOSPITAL_COMMUNITY): Payer: Self-pay | Admitting: *Deleted

## 2016-03-06 DIAGNOSIS — Z3493 Encounter for supervision of normal pregnancy, unspecified, third trimester: Secondary | ICD-10-CM | POA: Diagnosis present

## 2016-03-06 DIAGNOSIS — O9962 Diseases of the digestive system complicating childbirth: Secondary | ICD-10-CM | POA: Diagnosis present

## 2016-03-06 DIAGNOSIS — K219 Gastro-esophageal reflux disease without esophagitis: Secondary | ICD-10-CM | POA: Diagnosis present

## 2016-03-06 DIAGNOSIS — Z3A40 40 weeks gestation of pregnancy: Secondary | ICD-10-CM

## 2016-03-06 LAB — TYPE AND SCREEN
ABO/RH(D): AB POS
Antibody Screen: NEGATIVE

## 2016-03-06 LAB — CBC
HCT: 36.9 % (ref 36.0–46.0)
Hemoglobin: 13.2 g/dL (ref 12.0–15.0)
MCH: 29.9 pg (ref 26.0–34.0)
MCHC: 35.8 g/dL (ref 30.0–36.0)
MCV: 83.7 fL (ref 78.0–100.0)
PLATELETS: 190 10*3/uL (ref 150–400)
RBC: 4.41 MIL/uL (ref 3.87–5.11)
RDW: 14.4 % (ref 11.5–15.5)
WBC: 9.7 10*3/uL (ref 4.0–10.5)

## 2016-03-06 LAB — RPR: RPR: NONREACTIVE

## 2016-03-06 LAB — ABO/RH: ABO/RH(D): AB POS

## 2016-03-06 MED ORDER — SODIUM CHLORIDE 0.9% FLUSH
3.0000 mL | INTRAVENOUS | Status: DC | PRN
Start: 2016-03-06 — End: 2016-03-08

## 2016-03-06 MED ORDER — SOD CITRATE-CITRIC ACID 500-334 MG/5ML PO SOLN: 30.0000 mL | ORAL | Status: DC | PRN

## 2016-03-06 MED ORDER — TETANUS-DIPHTH-ACELL PERTUSSIS 5-2.5-18.5 LF-MCG/0.5 IM SUSP: 0.5000 mL | Freq: Once | INTRAMUSCULAR | Status: DC

## 2016-03-06 MED ORDER — LACTATED RINGERS IV SOLN: 500.0000 mL | INTRAVENOUS | Status: DC | PRN

## 2016-03-06 MED ORDER — PRENATAL MULTIVITAMIN CH
1.0000 | ORAL_TABLET | Freq: Every day | ORAL | Status: DC
  Administered 2016-03-06 – 2016-03-07 (×2): 1 via ORAL
  Filled 2016-03-06 (×2): qty 1

## 2016-03-06 MED ORDER — OXYCODONE HCL 5 MG PO TABS
10.0000 mg | ORAL_TABLET | ORAL | Status: DC | PRN
  Administered 2016-03-06: 10 mg via ORAL
  Filled 2016-03-06: qty 2

## 2016-03-06 MED ORDER — SODIUM CHLORIDE 0.9 % IV SOLN: 250.0000 mL | INTRAVENOUS | Status: DC | PRN

## 2016-03-06 MED ORDER — COCONUT OIL OIL
1.0000 "application " | TOPICAL_OIL | Status: DC | PRN
  Administered 2016-03-07: 1 via TOPICAL
  Filled 2016-03-06: qty 120

## 2016-03-06 MED ORDER — OXYTOCIN 40 UNITS IN LACTATED RINGERS INFUSION - SIMPLE MED
2.5000 [IU]/h | INTRAVENOUS | Status: DC
Start: 2016-03-06 — End: 2016-03-06
  Filled 2016-03-06: qty 1000

## 2016-03-06 MED ORDER — EPHEDRINE 5 MG/ML INJ: 10.0000 mg | INTRAVENOUS | Status: DC | PRN

## 2016-03-06 MED ORDER — OXYCODONE-ACETAMINOPHEN 5-325 MG PO TABS: 2.0000 | ORAL_TABLET | ORAL | Status: DC | PRN

## 2016-03-06 MED ORDER — DIPHENHYDRAMINE HCL 50 MG/ML IJ SOLN: 12.5000 mg | INTRAMUSCULAR | Status: DC | PRN

## 2016-03-06 MED ORDER — ONDANSETRON HCL 4 MG/2ML IJ SOLN: 4.0000 mg | INTRAMUSCULAR | Status: DC | PRN

## 2016-03-06 MED ORDER — OXYCODONE-ACETAMINOPHEN 5-325 MG PO TABS: 1.0000 | ORAL_TABLET | ORAL | Status: DC | PRN

## 2016-03-06 MED ORDER — FENTANYL 2.5 MCG/ML BUPIVACAINE 1/10 % EPIDURAL INFUSION (WH - ANES): 14.0000 mL/h | INTRAMUSCULAR | Status: DC | PRN

## 2016-03-06 MED ORDER — SIMETHICONE 80 MG PO CHEW: 80.0000 mg | CHEWABLE_TABLET | ORAL | Status: DC | PRN

## 2016-03-06 MED ORDER — OXYCODONE HCL 5 MG PO TABS
5.0000 mg | ORAL_TABLET | ORAL | Status: DC | PRN
  Administered 2016-03-06: 5 mg via ORAL
  Filled 2016-03-06: qty 1

## 2016-03-06 MED ORDER — LACTATED RINGERS IV SOLN: 500.0000 mL | Freq: Once | INTRAVENOUS | Status: DC

## 2016-03-06 MED ORDER — BENZOCAINE-MENTHOL 20-0.5 % EX AERO
1.0000 "application " | INHALATION_SPRAY | CUTANEOUS | Status: DC | PRN
  Administered 2016-03-06: 1 via TOPICAL
  Filled 2016-03-06: qty 56

## 2016-03-06 MED ORDER — DIBUCAINE 1 % RE OINT: 1.0000 "application " | TOPICAL_OINTMENT | RECTAL | Status: DC | PRN

## 2016-03-06 MED ORDER — OXYTOCIN BOLUS FROM INFUSION: 500.0000 mL | Freq: Once | INTRAVENOUS | Status: DC

## 2016-03-06 MED ORDER — FENTANYL CITRATE (PF) 100 MCG/2ML IJ SOLN
50.0000 ug | INTRAMUSCULAR | Status: DC | PRN
  Administered 2016-03-06: 100 ug via INTRAVENOUS
  Filled 2016-03-06: qty 2

## 2016-03-06 MED ORDER — ACETAMINOPHEN 325 MG PO TABS
650.0000 mg | ORAL_TABLET | ORAL | Status: DC | PRN
  Administered 2016-03-06: 650 mg via ORAL
  Filled 2016-03-06: qty 2

## 2016-03-06 MED ORDER — LIDOCAINE HCL (PF) 1 % IJ SOLN
30.0000 mL | INTRAMUSCULAR | Status: DC | PRN
  Filled 2016-03-06: qty 30

## 2016-03-06 MED ORDER — OXYTOCIN 40 UNITS IN LACTATED RINGERS INFUSION - SIMPLE MED: 2.5000 [IU]/h | INTRAVENOUS | Status: DC | PRN

## 2016-03-06 MED ORDER — LACTATED RINGERS IV SOLN
INTRAVENOUS | Status: DC
  Administered 2016-03-06: 02:00:00 via INTRAVENOUS

## 2016-03-06 MED ORDER — FLEET ENEMA 7-19 GM/118ML RE ENEM: 1.0000 | ENEMA | RECTAL | Status: DC | PRN

## 2016-03-06 MED ORDER — SODIUM CHLORIDE 0.9% FLUSH: 3.0000 mL | Freq: Two times a day (BID) | INTRAVENOUS | Status: DC

## 2016-03-06 MED ORDER — PHENYLEPHRINE 40 MCG/ML (10ML) SYRINGE FOR IV PUSH (FOR BLOOD PRESSURE SUPPORT): 80.0000 ug | PREFILLED_SYRINGE | INTRAVENOUS | Status: DC | PRN

## 2016-03-06 MED ORDER — DIPHENHYDRAMINE HCL 25 MG PO CAPS: 25.0000 mg | ORAL_CAPSULE | Freq: Four times a day (QID) | ORAL | Status: DC | PRN

## 2016-03-06 MED ORDER — WITCH HAZEL-GLYCERIN EX PADS: 1.0000 "application " | MEDICATED_PAD | CUTANEOUS | Status: DC | PRN

## 2016-03-06 MED ORDER — ONDANSETRON HCL 4 MG PO TABS: 4.0000 mg | ORAL_TABLET | ORAL | Status: DC | PRN

## 2016-03-06 MED ORDER — IBUPROFEN 600 MG PO TABS
600.0000 mg | ORAL_TABLET | Freq: Four times a day (QID) | ORAL | Status: DC
  Administered 2016-03-06 – 2016-03-08 (×10): 600 mg via ORAL
  Filled 2016-03-06 (×10): qty 1

## 2016-03-06 MED ORDER — SENNOSIDES-DOCUSATE SODIUM 8.6-50 MG PO TABS
2.0000 | ORAL_TABLET | ORAL | Status: DC
  Administered 2016-03-06 – 2016-03-07 (×2): 2 via ORAL
  Filled 2016-03-06 (×2): qty 2

## 2016-03-06 MED ORDER — ZOLPIDEM TARTRATE 5 MG PO TABS: 5.0000 mg | ORAL_TABLET | Freq: Every evening | ORAL | Status: DC | PRN

## 2016-03-06 MED ORDER — ONDANSETRON HCL 4 MG/2ML IJ SOLN: 4.0000 mg | Freq: Four times a day (QID) | INTRAMUSCULAR | Status: DC | PRN

## 2016-03-06 MED ORDER — ACETAMINOPHEN 325 MG PO TABS: 650.0000 mg | ORAL_TABLET | ORAL | Status: DC | PRN

## 2016-03-06 NOTE — Lactation Note (Addendum)
This note was copied from a baby's chart. Lactation Consultation Note  P3, 6 hours. Mother states she breastfed her son for 1 year and daughter for a few days due to difficulty latching. Mother states this baby is breastfeeding well.  Mother falling in and out of sleep during consult. Grandmothers in room attending to infant.   Provided her w/ manual pump for home use.  Mother states she knows how to hand express. Mom encouraged to feed baby 8-12 times/24 hours and with feeding cues.  Mom made aware of O/P services, breastfeeding support groups, community resources, and our phone # for post-discharge questions.    Patient Name: Ashley [name] ZOXWR'UToday's Date: 03/06/2016 Reason for consult: Initial assessment   Maternal Data Formula Feeding for Exclusion: No Has patient been taught Hand Expression?: Yes Does the patient have breastfeeding experience prior to this delivery?: Yes  Feeding Feeding Type: Breast Fed Length of feed: 30 min  LATCH Score/Interventions Latch: Grasps breast easily, tongue down, lips flanged, rhythmical sucking.  Audible Swallowing: A few with stimulation Intervention(s): Skin to skin;Hand expression  Type of Nipple: Everted at rest and after stimulation  Comfort (Breast/Nipple): Soft / non-tender     Hold (Positioning): No assistance needed to correctly position infant at breast.  LATCH Score: 9  Lactation Tools Discussed/Used     Consult Status Consult Status: Follow-up Date: 03/07/16 Follow-up type: In-patient    Dahlia ByesBerkelhammer, Jennavieve Arrick [facility] 03/06/2016, 10:54 AM

## 2016-03-06 NOTE — H&P (Signed)
LABOR AND DELIVERY ADMISSION HISTORY AND PHYSICAL NOTE  Ashley [name] is a 30 y.o. female [phone] with IUP at 252w1d by 6 wk US presenting for SOL.   Contractions started yesterday morning. She reports positive fetal movement. She denies leakage of fluid or vaginal bleeding.   Past Medical History: Past Medical History:  Diagnosis Date  . Asthma     Past Surgical History: Past Surgical History:  Procedure Laterality Date  . NO PAST SURGERIES      Obstetrical History: OB History    Gravida Para Term Preterm AB Living   4 2 2   1 2    SAB TAB Ectopic Multiple Live Births   1       2      Social History: Social History   Social History  . Marital status: Single    Spouse name: N/A  . Number of children: N/A  . Years of education: N/A   Social History Main Topics  . Smoking status: Never Smoker  . Smokeless tobacco: Never Used  . Alcohol use No  . Drug use: No  . Sexual activity: Yes    Birth control/ protection: Condom   Other Topics Concern  . None   Social History Narrative  . None    Family History: History reviewed. No pertinent family history.  Allergies: No Known Allergies  Prescriptions Prior to Admission  Medication Sig Dispense Refill Last Dose  . acetaminophen (TYLENOL) 500 MG tablet Take 1,000 mg by mouth every 6 (six) hours as needed for mild pain or headache.   Taking  . butalbital-acetaminophen-caffeine (FIORICET, ESGIC) 50-325-40 MG tablet Take 1-2 tablets by mouth every 6 (six) hours as needed for headache. (Patient not taking: Reported on 02/17/2016) 20 tablet 0 Not Taking  . cyclobenzaprine (FLEXERIL) 10 MG tablet Take 1 tablet (10 mg total) by mouth 3 (three) times daily as needed for muscle spasms. (Patient not taking: Reported on 02/17/2016) 20 tablet 0 Not Taking  . omeprazole (PRILOSEC) 20 MG capsule Take 1 capsule (20 mg total) by mouth 2 (two) times daily before a meal. (Patient not taking: Reported on 02/17/2016) 60 capsule 5 Not  Taking  . Prenat-FeCbn-FeAspGl-FA-Omega (OB COMPLETE PETITE) 35-5-1-200 MG CAPS Take 1 capsule by mouth daily before breakfast. 90 capsule 3 Taking     Review of Systems   All systems reviewed and negative except as stated in HPI  Blood pressure 119/77, pulse 79, temperature 97.4 F (36.3 C), resp. rate 20, height 5\' 7"  (1.702 m), weight 230 lb 3.2 oz (104.4 kg), last menstrual period 05/05/2015, currently breastfeeding. General appearance: alert, cooperative, appears stated age and no distress Lungs: clear to auscultation bilaterally Heart: regular rate and rhythm Abdomen: soft, non-tender; bowel sounds normal Extremities: No calf swelling or tenderness Presentation: cephalic Fetal monitoring: 120 bpm, mod var, +accels, no decels Uterine activity: q2-5 min Dilation: 5 Effacement (%): 70 Station: -1 Exam by:: Daleen SquibbJo B. rn   Prenatal labs: ABO, Rh: AB/Positive/-- (07/12 1750) Antibody: Negative (07/12 1750) Rubella: !Error! IMMUNE RPR: Non Reactive (10/06 1045)  HBsAg: Negative (07/12 1750)  HIV: Non Reactive (10/06 1045)  GBS: Negative (12/06 0000)  2 hr Glucola: 79/139/98 Genetic screening:  Declined Anatomy US: Normal, female  Prenatal Transfer Tool  Maternal Diabetes: No Genetic Screening: Declined Maternal Ultrasounds/Referrals: Normal Fetal Ultrasounds or other Referrals:  None Maternal Substance Abuse:  No Significant Maternal Medications:  None Significant Maternal Lab Results: Lab values include: Group B Strep negative  Results for orders placed  or performed during the hospital encounter of 03/06/16 (from the past 24 hour(s))  CBC   Collection Time: 03/06/16  1:55 AM  Result Value Ref Range   WBC 9.7 4.0 - 10.5 K/uL   RBC 4.41 3.87 - 5.11 MIL/uL   Hemoglobin 13.2 12.0 - 15.0 g/dL   HCT 69.636.9 29.536.0 - 28.446.0 %   MCV 83.7 78.0 - 100.0 fL   MCH 29.9 26.0 - 34.0 pg   MCHC 35.8 30.0 - 36.0 g/dL   RDW 13.214.4 44.011.5 - 10.215.5 %   Platelets 190 150 - 400 K/uL    Patient  Active Problem List   Diagnosis Date Noted  . Encounter for supervision of low-risk pregnancy in third trimester 02/17/2016  . GERD (gastroesophageal reflux disease) 11/13/2015  . Active labor at term 06/21/2013  . Normal delivery 06/21/2013  . Encounter for supervision of other normal pregnancy 11/15/2012    Assessment: Ashley [name] is a 30 y.o. [id number] at 672w1d here for SOL.  #Labor: SOL #Pain:  IV pain meds prn, epidural on request #FWB:  Cat I #ID:  GBS Neg #MOF:  Both #MOC: IUD, unsure #Circ:  Outpatient  Jen MowElizabeth Equilla Que, DO OB Fellow [facility] 03/06/2016, 2:07 AM

## 2016-03-06 NOTE — MAU Note (Signed)
Contractions since 0700 Sat. Denies LOF or bleeding

## 2016-03-07 NOTE — Progress Notes (Signed)
Post Partum Day 1 Subjective: no complaints, up ad lib, voiding, tolerating PO and + flatus  Objective: Blood pressure 113/78, pulse 77, temperature 98.3 F (36.8 C), temperature source Oral, resp. rate 20, height 5\' 7"  (1.702 m), weight 230 lb 3.2 oz (104.4 kg), last menstrual period 05/05/2015, unknown if currently breastfeeding.  Physical Exam:  General: alert, cooperative, appears stated age and no distress Lochia: appropriate Uterine Fundus: firm Incision: n/a DVT Evaluation: No evidence of DVT seen on physical exam.   Recent Labs  03/06/16 0155  HGB 13.2  HCT 36.9    Assessment/Plan: Plan for discharge tomorrow   LOS: 1 day   Ashley [name] 03/07/2016, 8:29 AM

## 2016-03-07 NOTE — Lactation Note (Signed)
This note was copied from a baby's chart. Lactation Consultation Note  Patient Name: Boy Waldemar DickensMitray Chambless [id number]'s Date: 03/07/2016  Mom is c/o sore nipples and heavy breasts. Mom's milk is coming to volume. She requests to be set-up w/a DEBP.   Mom's nipples noted to have compression/positional stripes on both nipples. Mom is thinking about not offering breast and just giving pumped EBM at next feeding. Will also offer Comfort Gels.  Lurline HareRichey, Collette Pescador [facility] 03/07/2016, 9:31 PM

## 2016-03-07 NOTE — Lactation Note (Signed)
This note was copied from a baby's chart. Lactation Consultation Note  Patient Name: Ashley [name] [id number] Date: 03/07/2016 Reason for consult: Follow-up assessment  Mom pumped 79m of EBM in 15 min using the "initiate" setting. Infant took 233mvia bottle w/ease. Mom plans to just pump & BO during the remainder of her time in the hospital. Mom encouraged not to allow herself to get to full & that she can now use the DEBP on its "maintenance" setting. Mom shown how to assemble & use hand pump that was included in pump kit.   Comfort Gels provided w/instructions for use. Mom was made aware that there is lactation support this evening from 2300 - 0700, if needed.   Ashley [name], 10:01 PM

## 2016-03-08 MED ORDER — OXYCODONE HCL 10 MG PO TABS: 10.0000 mg | ORAL_TABLET | ORAL | 0 refills | Status: DC | PRN

## 2016-03-08 MED ORDER — SENNOSIDES-DOCUSATE SODIUM 8.6-50 MG PO TABS: 2.0000 | ORAL_TABLET | Freq: Two times a day (BID) | ORAL | 1 refills | Status: DC

## 2016-03-08 MED ORDER — IBUPROFEN 600 MG PO TABS: 600.0000 mg | ORAL_TABLET | Freq: Four times a day (QID) | ORAL | 2 refills | Status: DC

## 2016-03-08 NOTE — Discharge Summary (Signed)
Obstetric Discharge Summary Reason for Admission: onset of labor and 7364w1d Prenatal Procedures: ultrasound Intrapartum Procedures: spontaneous vaginal delivery Postpartum Procedures: none Complications-Operative and Postpartum: none Hemoglobin  Date Value Ref Range Status  03/06/2016 13.2 12.0 - 15.0 g/dL Final   HCT  Date Value Ref Range Status  03/06/2016 36.9 36.0 - 46.0 % Final   Hematocrit  Date Value Ref Range Status  12/11/2015 36.9 34.0 - 46.6 % Final    Physical Exam:  General: alert, cooperative and no distress Lochia: appropriate Uterine Fundus: firm Incision: none DVT Evaluation: No evidence of DVT seen on physical exam. No cords or calf tenderness. No significant calf/ankle edema.  Discharge Diagnoses: Term Pregnancy-delivered  Discharge Information: Date: 03/08/2016 Activity: pelvic rest Diet: routine Medications: PNV, Ibuprofen, Colace and Percocet Condition: stable Instructions: refer to practice specific booklet Discharge to: home Follow-up Information    Ashley [name] A Ashley [name], CNM Follow up in 4 week(s).   Specialty:  Certified Nurse Midwife Why:  Postpartum exam, schedule IUD insertion Contact information: [address] [place] [id number] [phone]           Newborn Data: Live born female  Birth Weight: 6 lb 8 oz (2948 g) APGAR: 9, 9  Home with mother.  Ashley [name], CNM 03/08/2016, 7:39 AM

## 2016-03-08 NOTE — Lactation Note (Signed)
This note was copied from a baby's chart. Lactation Consultation Note  Patient Name: Boy Waldemar DickensMitray Weisheit BJYNW'GToday's Date: 03/08/2016 Reason for consult: Follow-up assessment;Other (Comment);Breast/nipple pain (3% weight loss, milk in bilaterally ) Baby is 55 hours old and until last night was breast feeding exclusively and due to sore nipples was set up with a DEBP . Per mom has  pumped both breast 2-3 times in since the pump was set up. Milk is in both breast. Per mom since I have pumped I did work on re- latching now that the milk is in.  [facility] breast tissue with moms permission and noted  Both breast to be softened to full. Not over full or engorged. Both nipples appear to be healthy, no break down. Mom mentioned she had a small blister on the right nipple and today the blister is reabsorbed. LC noted the areolas are tender when compressed.  LC reviewed hand expressing , and the milk flows steadily. LC encouraged to use her EBM on her nipples liberally. LC asked mom what breastfeeding position she was using the most and she mentioned the cradle. LC recommended to hold off on using the cradle until the the baby develops stronger muscles in neck. In the mean time to prevent increased sore ness to use the football or cross cradle to obtain the depth consistently so the soreness continues to improve.  Sore nipple and engorgement prevention and tx reviewed. Mom already has comfort gels.  Mom already has a hand pump, and a DEBP.  Per mom plans to re-latch the baby today when she gets home. LC stressed the importance of always softening the 1st breast before latching the 2nd breast and if the baby only feeds 1st breast to release down with hand expressing or pumping to comfort.  Per mom active with [facility] [place] - and is aware of their services.  Mother informed of post-discharge support and given phone number to the lactation department, including services for phone call assistance; out-patient appointments; and  breastfeeding support group. List of other breastfeeding resources in the community given in the handout. Encouraged mother to call for problems or concerns related to breastfeeding.  Maternal Data Has patient been taught Hand Expression?: Yes  Feeding Feeding Type:  (last fed at 1000 35 mll of EBM ) Nipple Type: Slow - flow  LATCH Score/Interventions                Intervention(s): Breastfeeding basics reviewed     Lactation Tools Discussed/Used Tools: Pump Breast pump type: Double-Electric Breast Pump (and manual pump ) WIC Program: Yes Pump Review:  (already set up and per mom aware of how to use it )   Consult Status Consult Status: Complete Date: 03/08/16    Matilde SprangMargaret Ann Devota Viruet 03/08/2016, 11:06 AM

## 2016-03-08 NOTE — Plan of Care (Signed)
Problem: Health Behavior/Discharge Planning: Goal: Ability to manage health-related needs will improve Outcome: Completed/Met Date Met: 03/08/16 Discharge information reviewed with patient and significant other along with follow up appointments and signs and symptoms of infection. Patient verbalizes understanding of information.

## 2016-03-08 NOTE — Progress Notes (Signed)
Post Partum Day #1 Subjective: no complaints, up ad lib, voiding and tolerating PO  Objective: Blood pressure 98/61, pulse 75, temperature 98.1 F (36.7 C), temperature source Oral, resp. rate 18, height 5\' 7"  (1.702 m), weight 230 lb 3.2 oz (104.4 kg), last menstrual period 05/05/2015, SpO2 98 %, unknown if currently breastfeeding.  Physical Exam:  General: alert, cooperative and no distress Lochia: appropriate Uterine Fundus: firm Incision: none DVT Evaluation: No evidence of DVT seen on physical exam. No cords or calf tenderness. No significant calf/ankle edema.   Recent Labs  03/06/16 0155  HGB 13.2  HCT 36.9    Assessment/Plan: Discharge home, Breastfeeding and Contraception IUD   LOS: 2 days   Roe CoombsRachelle A Maebell [name], CNM 03/08/2016, 7:35 AM

## 2016-03-09 ENCOUNTER — Encounter: Payer: Medicaid Other | Admitting: Certified Nurse Midwife

## 2016-03-10 ENCOUNTER — Other Ambulatory Visit: Payer: Self-pay | Admitting: Obstetrics

## 2016-03-22 ENCOUNTER — Encounter: Payer: Self-pay | Admitting: *Deleted

## 2016-03-29 ENCOUNTER — Encounter: Payer: Self-pay | Admitting: Obstetrics

## 2016-03-29 ENCOUNTER — Ambulatory Visit (INDEPENDENT_AMBULATORY_CARE_PROVIDER_SITE_OTHER): Payer: Medicaid Other | Admitting: Obstetrics

## 2016-03-29 DIAGNOSIS — B372 Candidiasis of skin and nail: Secondary | ICD-10-CM

## 2016-03-29 DIAGNOSIS — Z30013 Encounter for initial prescription of injectable contraceptive: Secondary | ICD-10-CM | POA: Diagnosis not present

## 2016-03-29 DIAGNOSIS — Z3009 Encounter for other general counseling and advice on contraception: Secondary | ICD-10-CM | POA: Diagnosis not present

## 2016-03-29 MED ORDER — MEDROXYPROGESTERONE ACETATE 150 MG/ML IM SUSP: 150.0000 mg | INTRAMUSCULAR | 4 refills | Status: DC

## 2016-03-29 MED ORDER — NYSTATIN 100000 UNIT/GM EX CREA: 1.0000 "application " | TOPICAL_CREAM | Freq: Two times a day (BID) | CUTANEOUS | 1 refills | Status: DC

## 2016-03-29 MED ORDER — FLUCONAZOLE 200 MG PO TABS: ORAL_TABLET | ORAL | 0 refills | Status: DC

## 2016-03-29 MED ORDER — OB COMPLETE PETITE 35-5-1-200 MG PO CAPS: 1.0000 | ORAL_CAPSULE | Freq: Every day | ORAL | 3 refills | Status: DC

## 2016-03-29 NOTE — Progress Notes (Signed)
Subjective:     Ashley [name] is a 31 y.o. female who presents for a postpartum visit. She is 3 weeks postpartum following a spontaneous vaginal delivery.  She complains of sore nipples that is interfering with breast feeding and pumping.  I have fully reviewed the prenatal and intrapartum course. The delivery was at 40 gestational weeks. Outcome: spontaneous vaginal delivery. Anesthesia: none. Postpartum course has been normal. Baby's course has been normal. Baby is feeding by breast. Bleeding thin lochia. Bowel function is normal. Bladder function is normal. Patient is not sexually active. Contraception method is abstinence. Postpartum depression screening: negative.  Tobacco, alcohol and substance abuse history reviewed.  Adult immunizations reviewed including TDAP, rubella and varicella.  The following portions of the patient's history were reviewed and updated as appropriate: allergies, current medications, past family history, past medical history, past social history, past surgical history and problem list.  Review of Systems A comprehensive review of systems was negative.   Objective:    BP 118/83   Pulse 80   Breastfeeding? Yes   General:  alert and no distress   Breasts:  Sore and cracked nipples bilaterally  Lungs: clear to auscultation bilaterally  Heart:  regular rate and rhythm, S1, S2 normal, no murmur, click, rub or gallop     50% of 15 min visit spent on counseling and coordination of care.    Assessment:    3 weeks postpartum  Candida mastitis   Contraceptive counseling and advice  Plan:    1. Nystatin cream and Diflucan Rx  2. Contraception: Depo-Provera injections  3. Depo Provera Rx  4. Follow up in: 4 weeks or as needed.   Healthy lifestyle practices reviewed

## 2016-03-29 NOTE — Progress Notes (Signed)
Patient is having pain in her nipple. She has sores on her nipples. She is having pain in her abdomen when she is sitting or stretching. Patient is using Ibuprofen to help with breastfeeding. Patient is pumping also- and she has pian with that. Patient has breast fed before and she has not had this trouble.

## 2016-03-31 ENCOUNTER — Ambulatory Visit (INDEPENDENT_AMBULATORY_CARE_PROVIDER_SITE_OTHER): Payer: Medicaid Other

## 2016-03-31 VITALS — BP 130/78 | HR 82 | Temp 98.3°F

## 2016-03-31 DIAGNOSIS — Z3042 Encounter for surveillance of injectable contraceptive: Secondary | ICD-10-CM | POA: Diagnosis not present

## 2016-03-31 DIAGNOSIS — Z308 Encounter for other contraceptive management: Secondary | ICD-10-CM

## 2016-03-31 MED ORDER — MEDROXYPROGESTERONE ACETATE 150 MG/ML IM SUSP
150.0000 mg | Freq: Once | INTRAMUSCULAR | Status: AC
  Administered 2016-03-31: 150 mg via INTRAMUSCULAR

## 2016-03-31 NOTE — Progress Notes (Signed)
Patient in office for routine Depo injection, Tolerated well, given in right hip.

## 2016-04-06 ENCOUNTER — Telehealth: Payer: Self-pay | Admitting: *Deleted

## 2016-04-06 NOTE — Telephone Encounter (Signed)
Pt called to office stating she is still having pain in breast, with breastfeeding.  Attempt to contact pt.  LM on VM making pt aware Dr Clearance CootsHarper recommends following treatment plan from last visit.  Advised that if no change or worse to call office Monday for f/u appt. Pt also [facility] to [facility] to eval breastfeeding. Advised to call office if any other questions.

## 2016-04-12 ENCOUNTER — Ambulatory Visit: Payer: Medicaid Other | Admitting: Obstetrics

## 2016-04-26 ENCOUNTER — Encounter: Payer: Self-pay | Admitting: Obstetrics

## 2016-04-26 ENCOUNTER — Ambulatory Visit (INDEPENDENT_AMBULATORY_CARE_PROVIDER_SITE_OTHER): Payer: Medicaid Other | Admitting: Obstetrics

## 2016-04-26 DIAGNOSIS — Z308 Encounter for other contraceptive management: Secondary | ICD-10-CM

## 2016-04-26 NOTE — Progress Notes (Signed)
Post Partum Exam  Ashley [name] is a 31 y.o. [phone] female who presents for a postpartum visit. She is 6 weeks postpartum following a spontaneous vaginal delivery. I have fully reviewed the prenatal and intrapartum course. The delivery was at 41gestational weeks.  Anesthesia: none. Postpartum course has been normal. Baby's course has been normal. Baby is feeding by both breast and bottle - Similac Advance. Bleeding staining only. Bowel function is abnormal: Constipation. Bladder function is normal. Patient is not sexually active. Contraception method is Depo-Provera injections. Postpartum depression screening:neg  The following portions of the patient's history were reviewed and updated as appropriate: allergies, current medications, past family history, past medical history, past social history, past surgical history and problem list.  Review of Systems A comprehensive review of systems was negative.    Objective:  currently breastfeeding.  General:  alert and no distress   Breasts:  inspection negative, no nipple discharge or bleeding, no masses or nodularity palpable  Lungs: clear to auscultation bilaterally  Heart:  regular rate and rhythm, S1, S2 normal, no murmur, click, rub or gallop  Abdomen: soft, non-tender; bowel sounds normal; no masses,  no organomegaly   Vulva:  normal  Vagina: normal vagina  Cervix:  no cervical motion tenderness  Corpus: normal size, contour, position, consistency, mobility, non-tender  Adnexa:  no mass, fullness, tenderness  Rectal Exam: Not performed.        Assessment:    nORMAL postpartum exam. Pap smear not done at today's visit.   Plan:   1. Contraception: Depo-Provera injections 2. dEPO pROVERA rX 3. Follow up in: 3 months or as needed.

## 2016-06-21 ENCOUNTER — Ambulatory Visit: Payer: Medicaid Other

## 2016-06-30 ENCOUNTER — Ambulatory Visit: Payer: Medicaid Other | Admitting: Obstetrics

## 2016-07-15 ENCOUNTER — Emergency Department (HOSPITAL_COMMUNITY)
Admission: EM | Admit: 2016-07-15 | Discharge: 2016-07-15 | Disposition: A | Discharge status: Home / Self Care | Payer: Medicaid Other | Attending: Emergency Medicine | Admitting: Emergency Medicine

## 2016-07-15 ENCOUNTER — Emergency Department (HOSPITAL_COMMUNITY): Payer: Medicaid Other

## 2016-07-15 ENCOUNTER — Encounter (HOSPITAL_COMMUNITY): Payer: Self-pay | Admitting: *Deleted

## 2016-07-15 DIAGNOSIS — Y999 Unspecified external cause status: Secondary | ICD-10-CM | POA: Insufficient documentation

## 2016-07-15 DIAGNOSIS — Z79899 Other long term (current) drug therapy: Secondary | ICD-10-CM | POA: Insufficient documentation

## 2016-07-15 DIAGNOSIS — S199XXA Unspecified injury of neck, initial encounter: Secondary | ICD-10-CM | POA: Diagnosis present

## 2016-07-15 DIAGNOSIS — Y939 Activity, unspecified: Secondary | ICD-10-CM | POA: Diagnosis not present

## 2016-07-15 DIAGNOSIS — S161XXA Strain of muscle, fascia and tendon at neck level, initial encounter: Secondary | ICD-10-CM | POA: Diagnosis not present

## 2016-07-15 DIAGNOSIS — Y92410 Unspecified street and highway as the place of occurrence of the external cause: Secondary | ICD-10-CM | POA: Diagnosis not present

## 2016-07-15 DIAGNOSIS — J45909 Unspecified asthma, uncomplicated: Secondary | ICD-10-CM | POA: Insufficient documentation

## 2016-07-15 MED ORDER — CYCLOBENZAPRINE HCL 10 MG PO TABS
5.0000 mg | ORAL_TABLET | Freq: Once | ORAL | Status: AC
  Administered 2016-07-15: 5 mg via ORAL
  Filled 2016-07-15: qty 1

## 2016-07-15 MED ORDER — IBUPROFEN 400 MG PO TABS
600.0000 mg | ORAL_TABLET | Freq: Once | ORAL | Status: AC
  Administered 2016-07-15: 20:00:00 600 mg via ORAL
  Filled 2016-07-15: qty 1

## 2016-07-15 MED ORDER — METHOCARBAMOL 500 MG PO TABS: 500.0000 mg | ORAL_TABLET | Freq: Two times a day (BID) | ORAL | 0 refills | Status: DC

## 2016-07-15 MED ORDER — DICLOFENAC SODIUM 50 MG PO TBEC: 50.0000 mg | DELAYED_RELEASE_TABLET | Freq: Two times a day (BID) | ORAL | 0 refills | Status: DC
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
## 2016-07-15 NOTE — Discharge Instructions (Signed)
Do not take the muscle relaxant if driving as it will make you sleepy.  °

## 2016-07-26 ENCOUNTER — Encounter: Payer: Self-pay | Admitting: Obstetrics

## 2016-07-26 ENCOUNTER — Other Ambulatory Visit (HOSPITAL_COMMUNITY)
Admission: RE | Admit: 2016-07-26 | Discharge: 2016-07-26 | Disposition: A | Discharge status: Home / Self Care | Payer: Medicaid Other | Source: Ambulatory Visit | Attending: Obstetrics | Admitting: Obstetrics

## 2016-07-26 ENCOUNTER — Ambulatory Visit (INDEPENDENT_AMBULATORY_CARE_PROVIDER_SITE_OTHER): Payer: Medicaid Other | Admitting: Obstetrics

## 2016-07-26 VITALS — BP 114/77 | HR 84 | Ht 67.0 in | Wt 224.8 lb

## 2016-07-26 DIAGNOSIS — Z0000 Encounter for general adult medical examination without abnormal findings: Secondary | ICD-10-CM

## 2016-07-26 DIAGNOSIS — B373 Candidiasis of vulva and vagina: Secondary | ICD-10-CM | POA: Insufficient documentation

## 2016-07-26 DIAGNOSIS — B9689 Other specified bacterial agents as the cause of diseases classified elsewhere: Secondary | ICD-10-CM | POA: Insufficient documentation

## 2016-07-26 DIAGNOSIS — Z01419 Encounter for gynecological examination (general) (routine) without abnormal findings: Secondary | ICD-10-CM | POA: Diagnosis not present

## 2016-07-26 DIAGNOSIS — Z3202 Encounter for pregnancy test, result negative: Secondary | ICD-10-CM

## 2016-07-26 DIAGNOSIS — R200 Anesthesia of skin: Secondary | ICD-10-CM

## 2016-07-26 DIAGNOSIS — N898 Other specified noninflammatory disorders of vagina: Secondary | ICD-10-CM

## 2016-07-26 DIAGNOSIS — N912 Amenorrhea, unspecified: Secondary | ICD-10-CM

## 2016-07-26 DIAGNOSIS — R202 Paresthesia of skin: Secondary | ICD-10-CM

## 2016-07-26 LAB — POCT URINE PREGNANCY: Preg Test, Ur: NEGATIVE

## 2016-07-26 NOTE — Progress Notes (Signed)
Last Pap 06/30/2015 Normal

## 2016-07-26 NOTE — Progress Notes (Signed)
[facility] Pt states she is sexually active and received Depo injection right after delivering in Dec 2017, but did not continue with injections.

## 2016-07-27 ENCOUNTER — Encounter: Payer: Self-pay | Admitting: Obstetrics

## 2016-07-27 LAB — CERVICOVAGINAL ANCILLARY ONLY
Bacterial vaginitis: NEGATIVE
CANDIDA VAGINITIS: POSITIVE — AB
CHLAMYDIA, DNA PROBE: NEGATIVE
Neisseria Gonorrhea: NEGATIVE
Trichomonas: NEGATIVE

## 2016-07-27 NOTE — Progress Notes (Signed)
Subjective:        Ashley [name] is a 31 y.o. female here for a routine exam.  Current complaints: Numbness of right arm and hand.    Personal health questionnaire:  Is patient Ashley [name], have a family history of breast and/or ovarian cancer: no Is there a family history of uterine cancer diagnosed at age < 19, gastrointestinal cancer, urinary tract cancer, family member who is a Personnel officer syndrome-associated carrier: no Is the patient overweight and hypertensive, family history of diabetes, personal history of gestational diabetes, preeclampsia or PCOS: no Is patient over 38, have PCOS,  family history of premature CHD under age 67, diabetes, smoke, have hypertension or peripheral artery disease:  no At any time, has a partner hit, kicked or otherwise hurt or frightened you?: no Over the past 2 weeks, have you felt down, depressed or hopeless?: no Over the past 2 weeks, have you felt little interest or pleasure in doing things?:no   Gynecologic History Patient's last menstrual period was 03/07/2016. Contraception: none Last Pap: 2017. Results were: normal Last mammogram: n/a. Results were: n/a  Obstetric History OB History  Gravida Para Term Preterm AB Living  4 3 3   1 3   SAB TAB Ectopic Multiple Live Births  1     0 3    # Outcome Date GA Lbr Len/2nd Weight Sex Delivery Anes PTL Lv  4 Term 03/06/16 [redacted]w[redacted]d 14:55 / 00:03 6 lb 8 oz (2.948 kg) M Vag-Spont None  LIV  3 Term 06/21/13 [redacted]w[redacted]d 00:52 / 00:05 7 lb 2.1 oz (3.235 kg) M Vag-Spont None  LIV  2 Term 05/18/11 [redacted]w[redacted]d  7 lb 13 oz (3.544 kg) F Vag-Spont EPI  LIV  1 SAB 03/07/08              Past Medical History:  Diagnosis Date  . Asthma     Past Surgical History:  Procedure Laterality Date  . NO PAST SURGERIES       Current Outpatient Prescriptions:  .  ibuprofen (ADVIL,MOTRIN) 600 MG tablet, Take 1 tablet (600 mg total) by mouth every 6 (six) hours., Disp: 120 tablet, Rfl: 2 .  diclofenac (VOLTAREN) 50 MG EC  tablet, Take 1 tablet (50 mg total) by mouth 2 (two) times daily. (Patient not taking: Reported on 07/26/2016), Disp: 15 tablet, Rfl: 0 .  fluconazole (DIFLUCAN) 200 MG tablet, Take 1 tablet by mouth every other day (Patient not taking: Reported on 07/26/2016), Disp: 3 tablet, Rfl: 0 .  medroxyPROGESTERone (DEPO-PROVERA) 150 MG/ML injection, Inject 1 mL (150 mg total) into the muscle every 3 (three) months. (Patient not taking: Reported on 07/26/2016), Disp: 1 mL, Rfl: 4 .  methocarbamol (ROBAXIN) 500 MG tablet, Take 1 tablet (500 mg total) by mouth 2 (two) times daily. (Patient not taking: Reported on 07/26/2016), Disp: 20 tablet, Rfl: 0 .  nystatin cream (MYCOSTATIN), Apply 1 application topically 2 (two) times daily. (Patient not taking: Reported on 07/26/2016), Disp: 60 g, Rfl: 1 .  oxyCODONE 10 MG TABS, Take 1 tablet (10 mg total) by mouth every 4 (four) hours as needed (pain scale > 7). (Patient not taking: Reported on 07/26/2016), Disp: 30 tablet, Rfl: 0 .  Prenat-FeCbn-FeAspGl-FA-Omega (OB COMPLETE PETITE) 35-5-1-200 MG CAPS, Take 1 capsule by mouth daily before breakfast. (Patient not taking: Reported on 07/26/2016), Disp: 90 capsule, Rfl: 3 .  senna-docusate (SENOKOT-S) 8.6-50 MG tablet, Take 2 tablets by mouth 2 (two) times daily. (Patient not taking: Reported on 07/26/2016), Disp: 60  tablet, Rfl: 1 No Known Allergies  Social History  Substance Use Topics  . Smoking status: Never Smoker  . Smokeless tobacco: Never Used  . Alcohol use No    History reviewed. No pertinent family history.    Review of Systems  Constitutional: negative for fatigue and weight loss Respiratory: negative for cough and wheezing Cardiovascular: negative for chest pain, fatigue and palpitations Gastrointestinal: negative for abdominal pain and change in bowel habits Musculoskeletal:positive for numbness and pain in right upper extremity   Neurological: negative for gait problems and tremors Behavioral/Psych:  negative for abusive relationship, depression Endocrine: negative for temperature intolerance    Genitourinary:negative for abnormal menstrual periods, genital lesions, hot flashes, sexual problems and vaginal discharge Integument/breast: negative for breast lump, breast tenderness, nipple discharge and skin lesion(s)    Objective:       BP 114/77   Pulse 84   Ht 5\' 7"  (1.702 m)   Wt 224 lb 12.8 oz (102 kg)   LMP 03/07/2016   Breastfeeding? Yes   BMI 35.21 kg/m  General:   alert  Skin:   no rash or abnormalities  Lungs:   clear to auscultation bilaterally  Heart:   regular rate and rhythm, S1, S2 normal, no murmur, click, rub or gallop  Breasts:   normal without suspicious masses, skin or nipple changes or axillary nodes  Abdomen:  normal findings: no organomegaly, soft, non-tender and no hernia  Pelvis:  External genitalia: normal general appearance Urinary system: urethral meatus normal and bladder without fullness, nontender Vaginal: normal without tenderness, induration or masses Cervix: normal appearance Adnexa: normal bimanual exam Uterus: anteverted and non-tender, normal size   Lab Review Urine pregnancy test Labs reviewed yes Radiologic studies reviewed no  50% of 20 min visit spent on counseling and coordination of care.    Assessment:     Healthy female exam.    Contraceptive Counseling and Advice.  Declines contraception.    Plan:    Referred ro Neurologist for RUE numbness and pain  Education reviewed: calcium supplements, depression evaluation, low fat, low cholesterol diet, safe sex/STD prevention, self breast exams and weight bearing exercise. Contraception: none. Follow up in: 1 year.   No orders of the defined types were placed in this encounter.  Orders Placed This Encounter  Procedures  . POCT urine pregnancy     Patient ID: Ashley [name], female   DOB: 10-30-1985, 31 y.o.   MRN: [id number]

## 2016-07-28 ENCOUNTER — Other Ambulatory Visit: Payer: Self-pay | Admitting: Obstetrics

## 2016-07-28 DIAGNOSIS — B372 Candidiasis of skin and nail: Secondary | ICD-10-CM

## 2016-07-28 LAB — CYTOLOGY - PAP
Diagnosis: NEGATIVE
HPV: NOT DETECTED

## 2016-07-28 MED ORDER — FLUCONAZOLE 200 MG PO TABS: ORAL_TABLET | ORAL | 2 refills | Status: DC

## 2016-07-29 ENCOUNTER — Other Ambulatory Visit: Payer: Self-pay | Admitting: Obstetrics

## 2016-08-02 ENCOUNTER — Ambulatory Visit (INDEPENDENT_AMBULATORY_CARE_PROVIDER_SITE_OTHER): Payer: Medicaid Other | Admitting: Orthopaedic Surgery

## 2016-08-02 DIAGNOSIS — M542 Cervicalgia: Secondary | ICD-10-CM | POA: Diagnosis not present

## 2016-08-02 MED ORDER — PREDNISONE 10 MG (21) PO TBPK: ORAL_TABLET | ORAL | 0 refills | Status: DC

## 2016-08-02 MED ORDER — IBUPROFEN 800 MG PO TABS: 800.0000 mg | ORAL_TABLET | Freq: Three times a day (TID) | ORAL | 2 refills | Status: DC | PRN

## 2016-08-02 MED ORDER — CYCLOBENZAPRINE HCL 5 MG PO TABS: 5.0000 mg | ORAL_TABLET | Freq: Three times a day (TID) | ORAL | 3 refills | Status: DC | PRN
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
## 2016-08-10 ENCOUNTER — Encounter: Payer: Self-pay | Admitting: Physical Therapy

## 2016-08-10 ENCOUNTER — Ambulatory Visit: Payer: Medicaid Other | Attending: Orthopaedic Surgery | Admitting: Physical Therapy

## 2016-08-10 DIAGNOSIS — M62838 Other muscle spasm: Secondary | ICD-10-CM | POA: Diagnosis present

## 2016-08-10 DIAGNOSIS — M542 Cervicalgia: Secondary | ICD-10-CM | POA: Diagnosis present

## 2016-08-11 ENCOUNTER — Encounter: Payer: Self-pay | Admitting: Physical Therapy
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
## 2016-08-22 ENCOUNTER — Ambulatory Visit: Payer: Medicaid Other | Admitting: Physical Therapy

## 2016-08-23 ENCOUNTER — Encounter: Payer: Medicaid Other | Admitting: Physical Therapy

## 2016-08-24 ENCOUNTER — Ambulatory Visit: Payer: Medicaid Other | Admitting: Physical Therapy

## 2016-08-30 ENCOUNTER — Ambulatory Visit (INDEPENDENT_AMBULATORY_CARE_PROVIDER_SITE_OTHER): Payer: Medicaid Other | Admitting: Obstetrics

## 2016-08-30 ENCOUNTER — Encounter: Payer: Self-pay | Admitting: Obstetrics

## 2016-08-30 ENCOUNTER — Ambulatory Visit: Payer: Medicaid Other | Admitting: Physical Therapy

## 2016-08-30 ENCOUNTER — Telehealth: Payer: Self-pay

## 2016-08-30 ENCOUNTER — Other Ambulatory Visit (HOSPITAL_COMMUNITY)
Admission: RE | Admit: 2016-08-30 | Discharge: 2016-08-30 | Disposition: A | Discharge status: Home / Self Care | Payer: Medicaid Other | Source: Ambulatory Visit | Attending: Obstetrics | Admitting: Obstetrics

## 2016-08-30 VITALS — BP 112/79 | HR 81 | Wt 228.0 lb

## 2016-08-30 DIAGNOSIS — N898 Other specified noninflammatory disorders of vagina: Secondary | ICD-10-CM | POA: Insufficient documentation

## 2016-08-30 DIAGNOSIS — J45909 Unspecified asthma, uncomplicated: Secondary | ICD-10-CM | POA: Diagnosis not present

## 2016-08-30 MED ORDER — TERCONAZOLE 0.8 % VA CREA: 1.0000 | TOPICAL_CREAM | Freq: Every day | VAGINAL | 0 refills | Status: DC

## 2016-08-30 NOTE — Progress Notes (Signed)
Patient ID: Ashley [name], female   DOB: Jan 05, 1986, 31 y.o.   MRN: [id number]  Chief Complaint  Patient presents with  . Vaginitis    HPI Ashley [name] is a 31 y.o. female.  Vaginal itching and discharge. HPI  Past Medical History:  Diagnosis Date  . Asthma     Past Surgical History:  Procedure Laterality Date  . NO PAST SURGERIES      History reviewed. No pertinent family history.  Social History Social History  Substance Use Topics  . Smoking status: Never Smoker  . Smokeless tobacco: Never Used  . Alcohol use No    No Known Allergies  Current Outpatient Prescriptions  Medication Sig Dispense Refill  . cyclobenzaprine (FLEXERIL) 5 MG tablet Take 1-2 tablets (5-10 mg total) by mouth 3 (three) times daily as needed for muscle spasms. 30 tablet 3  . ibuprofen (ADVIL,MOTRIN) 600 MG tablet Take 1 tablet (600 mg total) by mouth every 6 (six) hours. (Patient not taking: Reported on 08/30/2016) 120 tablet 2  . ibuprofen (ADVIL,MOTRIN) 800 MG tablet Take 1 tablet (800 mg total) by mouth every 8 (eight) hours as needed. (Patient not taking: Reported on 08/30/2016) 30 tablet 2  . medroxyPROGESTERone (DEPO-PROVERA) 150 MG/ML injection Inject 1 mL (150 mg total) into the muscle every 3 (three) months. (Patient not taking: Reported on 07/26/2016) 1 mL 4  . Prenat-FeCbn-FeAspGl-FA-Omega (OB COMPLETE PETITE) 35-5-1-200 MG CAPS Take 1 capsule by mouth daily before breakfast. (Patient not taking: Reported on 07/26/2016) 90 capsule 3  . terconazole (TERAZOL 3) 0.8 % vaginal cream Place 1 applicator vaginally at bedtime. 20 g 0   No current facility-administered medications for this visit.     Review of Systems Review of Systems Constitutional: negative for fatigue and weight loss Respiratory: negative for cough and wheezing Cardiovascular: negative for chest pain, fatigue and palpitations Gastrointestinal: negative for abdominal pain and change in bowel habits Genitourinary:positive for  vaginal itching and discharge Integument/breast: negative for nipple discharge Musculoskeletal:negative for myalgias Neurological: negative for gait problems and tremors Behavioral/Psych: negative for abusive relationship, depression Endocrine: negative for temperature intolerance      Blood pressure 112/79, pulse 81, weight 228 lb (103.4 kg), currently breastfeeding.  Physical Exam Physical Exam General:   alert  Skin:   no rash or abnormalities  Lungs:   clear to auscultation bilaterally  Heart:   regular rate and rhythm, S1, S2 normal, no murmur, click, rub or gallop  Breasts:   normal without suspicious masses, skin or nipple changes or axillary nodes  Abdomen:  normal findings: no organomegaly, soft, non-tender and no hernia  Pelvis:  External genitalia: normal general appearance Urinary system: urethral meatus normal and bladder without fullness, nontender Vaginal: normal without tenderness, induration or masses Cervix: normal appearance Adnexa: normal bimanual exam Uterus: anteverted and non-tender, normal size    50% of 15 min visit spent on counseling and coordination of care.    Data Reviewed Wet Prep  Assessment     1. Vaginal irritation Rx: - Cervicovaginal ancillary only - terconazole (TERAZOL 3) 0.8 % vaginal cream; Place 1 applicator vaginally at bedtime.  Dispense: 20 g; Refill: 0  2. Vaginal discharge Rx: - Cervicovaginal ancillary only - terconazole (TERAZOL 3) 0.8 % vaginal cream; Place 1 applicator vaginally at bedtime.  Dispense: 20 g; Refill: 0    Plan    No orders of the defined types were placed in this encounter.  Meds ordered this encounter  Medications  . terconazole (TERAZOL 3)  0.8 % vaginal cream    Sig: Place 1 applicator vaginally at bedtime.    Dispense:  20 g    Refill:  0

## 2016-08-30 NOTE — Telephone Encounter (Signed)
TC from pt requests visit c/o low abdominal and low back pain. Throbbing and "sweating" vagina. Pt scheduled today.

## 2016-08-31 ENCOUNTER — Ambulatory Visit: Payer: Medicaid Other | Admitting: Physical Therapy

## 2016-08-31 LAB — CERVICOVAGINAL ANCILLARY ONLY
Bacterial vaginitis: NEGATIVE
Candida vaginitis: POSITIVE — AB
TRICH (WINDOWPATH): NEGATIVE

## 2016-09-01 ENCOUNTER — Other Ambulatory Visit: Payer: Self-pay | Admitting: Obstetrics

## 2016-09-06 ENCOUNTER — Encounter: Payer: Medicaid Other | Admitting: Physical Therapy

## 2016-09-12 ENCOUNTER — Encounter: Payer: Self-pay | Admitting: Physical Therapy

## 2016-09-12 ENCOUNTER — Ambulatory Visit: Payer: Medicaid Other | Attending: Orthopaedic Surgery | Admitting: Physical Therapy

## 2016-09-12 DIAGNOSIS — M62838 Other muscle spasm: Secondary | ICD-10-CM | POA: Insufficient documentation

## 2016-09-12 DIAGNOSIS — M542 Cervicalgia: Secondary | ICD-10-CM | POA: Diagnosis present
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
## 2016-09-13 ENCOUNTER — Encounter: Payer: Medicaid Other | Admitting: Physical Therapy

## 2016-09-14 ENCOUNTER — Encounter: Payer: Medicaid Other | Admitting: Physical Therapy

## 2016-09-27 ENCOUNTER — Ambulatory Visit: Payer: Medicaid Other | Admitting: Physical Therapy

## 2016-11-09 ENCOUNTER — Telehealth: Payer: Self-pay

## 2016-11-09 NOTE — Telephone Encounter (Signed)
Pt called stating that she has been having heavy bleeding for about 3-4 weeks. She is currently on depo. She only had one inj in Feb/2018, and has not had another one. She would like to know what can be done to stop the bleeding.

## 2016-11-10 ENCOUNTER — Other Ambulatory Visit: Payer: Self-pay | Admitting: Obstetrics

## 2016-11-10 DIAGNOSIS — N939 Abnormal uterine and vaginal bleeding, unspecified: Secondary | ICD-10-CM

## 2016-11-10 MED ORDER — NORETHINDRONE-ETH ESTRADIOL 1-35 MG-MCG PO TABS: 1.0000 | ORAL_TABLET | Freq: Every day | ORAL | 2 refills | Status: DC

## 2016-11-10 NOTE — Telephone Encounter (Signed)
Left message for patient to return call.

## 2016-11-10 NOTE — Telephone Encounter (Signed)
Pt would like some OCP sent to [facility].

## 2016-11-10 NOTE — Telephone Encounter (Signed)
We can Rx OCP's for her.

## 2016-11-10 NOTE — Telephone Encounter (Signed)
[facility] 1/35 Rx 

## 2016-11-19 ENCOUNTER — Encounter (HOSPITAL_COMMUNITY): Payer: Self-pay

## 2016-11-19 ENCOUNTER — Ambulatory Visit (HOSPITAL_COMMUNITY)
Admission: EM | Admit: 2016-11-19 | Discharge: 2016-11-19 | Disposition: A | Discharge status: Home / Self Care | Payer: Medicaid Other | Attending: Family Medicine | Admitting: Family Medicine

## 2016-11-19 DIAGNOSIS — H60312 Diffuse otitis externa, left ear: Secondary | ICD-10-CM

## 2016-11-19 MED ORDER — NEOMYCIN-POLYMYXIN-HC 3.5-10000-1 OT SUSP: 4.0000 [drp] | Freq: Three times a day (TID) | OTIC | 1 refills | Status: DC

## 2016-11-19 NOTE — ED Provider Notes (Signed)
  [facility]   [id number] 11/19/16 Arrival Time: 1205   SUBJECTIVE:  Ashley [name] is a 31 y.o. female who presents to the urgent care with complaint of right ear discomfort for the past year.  She uses Q-tips several times a day as a habit. No hearing loss, tinnitus or dizziness.  No left ear problems. Pain worsens when swallowing or pulling on the ear.  Patient works as a Retail buyer     Past Medical History:  Diagnosis Date  . Asthma    No family history on file. Social History   Social History  . Marital status: Single    Spouse name: N/A  . Number of children: N/A  . Years of education: N/A   Occupational History  . Not on file.   Social History Main Topics  . Smoking status: Never Smoker  . Smokeless tobacco: Never Used  . Alcohol use No  . Drug use: No  . Sexual activity: Yes    Partners: Male   Other Topics Concern  . Not on file   Social History Narrative  . No narrative on file   Current Meds  Medication Sig  . [DISCONTINUED] cyclobenzaprine (FLEXERIL) 5 MG tablet Take 1-2 tablets (5-10 mg total) by mouth 3 (three) times daily as needed for muscle spasms.  . [DISCONTINUED] ibuprofen (ADVIL,MOTRIN) 600 MG tablet Take 1 tablet (600 mg total) by mouth every 6 (six) hours.  . [DISCONTINUED] ibuprofen (ADVIL,MOTRIN) 800 MG tablet Take 1 tablet (800 mg total) by mouth every 8 (eight) hours as needed.  . [DISCONTINUED] medroxyPROGESTERone (DEPO-PROVERA) 150 MG/ML injection Inject 1 mL (150 mg total) into the muscle every 3 (three) months.   No Known Allergies    ROS: As per HPI, remainder of ROS negative.   OBJECTIVE:   Vitals:   11/19/16 1232  BP: 115/83  Pulse: 75  Resp: 16  SpO2: 100%     General appearance: alert; no distress Eyes: PERRL; EOMI; conjunctiva normal HENT: normocephalic; atraumatic; TMs normal, canal normal, external ears normal without trauma; nasal mucosa normal; oral mucosa normal Neck: supple, no  adenopathy Back: no CVA tenderness Extremities: no cyanosis or edema; symmetrical with no gross deformities Skin: warm and dry Neurologic: normal gait; grossly normal Psychological: alert and cooperative; normal mood and affect      Labs:  Results for orders placed or performed in visit on 08/30/16  Cervicovaginal ancillary only  Result Value Ref Range   Bacterial vaginitis Negative for Bacterial Vaginitis Microorganisms    Candida vaginitis **POSITIVE for Candida species** (A)    Trichomonas Negative     Labs Reviewed - No data to display  No results found.     ASSESSMENT & PLAN:  1. Chronic diffuse otitis externa of left ear     Meds ordered this encounter  Medications  . neomycin-polymyxin-hydrocortisone (CORTISPORIN) 3.5-10000-1 OTIC suspension    Sig: Place 4 drops into the right ear 3 (three) times daily.    Dispense:  10 mL    Refill:  1    Reviewed expectations re: course of current medical issues. Questions answered. Outlined signs and symptoms indicating need for more acute intervention. Patient verbalized understanding. After Visit Summary given.    Procedures:      Elvina Sidle, MD 11/19/16 1235

## 2016-11-19 NOTE — Discharge Instructions (Signed)
Please follow up with specialist if pain is not improving

## 2016-11-19 NOTE — ED Triage Notes (Signed)
Pt presents today with R ear pain that has been bothering her on and off for a year.

## 2017-08-31 ENCOUNTER — Other Ambulatory Visit: Payer: Self-pay

## 2017-08-31 ENCOUNTER — Ambulatory Visit (HOSPITAL_COMMUNITY)
Admission: EM | Admit: 2017-08-31 | Discharge: 2017-08-31 | Disposition: A | Discharge status: Home / Self Care | Payer: Medicaid Other | Attending: Family Medicine | Admitting: Family Medicine

## 2017-08-31 ENCOUNTER — Encounter (HOSPITAL_COMMUNITY): Payer: Self-pay | Admitting: Emergency Medicine

## 2017-08-31 DIAGNOSIS — M545 Low back pain: Secondary | ICD-10-CM | POA: Diagnosis present

## 2017-08-31 DIAGNOSIS — N3000 Acute cystitis without hematuria: Secondary | ICD-10-CM | POA: Diagnosis not present

## 2017-08-31 LAB — POCT URINALYSIS DIP (DEVICE)
BILIRUBIN URINE: NEGATIVE
GLUCOSE, UA: NEGATIVE mg/dL
Hgb urine dipstick: NEGATIVE
Ketones, ur: NEGATIVE mg/dL
NITRITE: NEGATIVE
Protein, ur: NEGATIVE mg/dL
Specific Gravity, Urine: 1.02 (ref 1.005–1.030)
UROBILINOGEN UA: 0.2 mg/dL (ref 0.0–1.0)
pH: 7 (ref 5.0–8.0)

## 2017-08-31 LAB — POCT PREGNANCY, URINE: Preg Test, Ur: NEGATIVE

## 2017-08-31 MED ORDER — CEPHALEXIN 500 MG PO CAPS: 500.0000 mg | ORAL_CAPSULE | Freq: Two times a day (BID) | ORAL | 0 refills | Status: AC

## 2017-08-31 MED ORDER — PHENAZOPYRIDINE HCL 200 MG PO TABS: 200.0000 mg | ORAL_TABLET | Freq: Three times a day (TID) | ORAL | 0 refills | Status: DC

## 2017-08-31 NOTE — ED Provider Notes (Signed)
[facility]   [id number] 08/31/17 Arrival Time: 1238  SUBJECTIVE: History from: patient. Ashley [name] is a 32 y.o. female complains of left sided low back pain that began on 08/16/17.  Denies a precipitating event or specific injury.  Localizes the pain to the left-side low back pain.  Describes the pain as intermittent and burning sensation in character.  Has NOT tried OTC medications.  Denies worsening symptoms.  Reports similar symptoms in the past when she was pregnant.  Complains of chills, nausea, and breast tenderness.   Denies fever, vomiting, dysuria, polyuria, dysuria, increased urgency, flank pain, hematuria, vaginal discharge, or vaginal bleeding.    Patient's last menstrual period was 08/16/2017. Normal menstrual cycle for patient.  Last unprotected sex 2 weeks ago.   ROS: As per HPI.  Past Medical History:  Diagnosis Date  . Asthma    Past Surgical History:  Procedure Laterality Date  . NO PAST SURGERIES     No Known Allergies No current facility-administered medications on file prior to encounter.    Current Outpatient Medications on File Prior to Encounter  Medication Sig Dispense Refill  . neomycin-polymyxin-hydrocortisone (CORTISPORIN) 3.5-10000-1 OTIC suspension Place 4 drops into the right ear 3 (three) times daily. 10 mL 1  . norethindrone-ethinyl estradiol 1/35 (ORTHO-NOVUM 1/35, 28,) tablet Take 1 tablet by mouth daily. 1 Package 2   Social History   Socioeconomic History  . Marital status: Single    Spouse name: Not on file  . Number of children: Not on file  . Years of education: Not on file  . Highest education level: Not on file  Occupational History  . Not on file  Social Needs  . Financial resource strain: Not on file  . Food insecurity:    Worry: Not on file    Inability: Not on file  . Transportation needs:    Medical: Not on file    Non-medical: Not on file  Tobacco Use  . Smoking status: Never Smoker  . Smokeless tobacco:  Never Used  Substance and Sexual Activity  . Alcohol use: No  . Drug use: No  . Sexual activity: Yes    Partners: Male  Lifestyle  . Physical activity:    Days per week: Not on file    Minutes per session: Not on file  . Stress: Not on file  Relationships  . Social connections:    Talks on phone: Not on file    Gets together: Not on file    Attends religious service: Not on file    Active member of club or organization: Not on file    Attends meetings of clubs or organizations: Not on file    Relationship status: Not on file  . Intimate partner violence:    Fear of current or ex partner: Not on file    Emotionally abused: Not on file    Physically abused: Not on file    Forced sexual activity: Not on file  Other Topics Concern  . Not on file  Social History Narrative  . Not on file   No family history on file.  OBJECTIVE:  Vitals:   08/31/17 1321  BP: 116/87  Pulse: 68  Resp: 18  Temp: 98.3 F (36.8 C)  TempSrc: Oral  SpO2: 100%    General appearance: AOx3; in no acute distress.  Head: NCAT Lungs: CTA bilaterally Heart: RRR.  Clear S1 and S2 without murmur, gallops, or rubs.  Radial pulses 2+ bilaterally Abdomen: Non-distended; normal active  BS; mild suprapubic tenderness; negative murphy; negative McBurney; no rebound; negative guarding Skin: warm and dry Back: No CVA tenderness Neurologic: Ambulates without difficulty; Sensation intact about the upper/ lower extremities Psychological: alert and cooperative; normal mood and affect  LABS:   Results for orders placed or performed during the hospital encounter of 08/31/17 (from the past 24 hour(s))  Pregnancy, urine POC     Status: None   Collection Time: 08/31/17  1:29 PM  Result Value Ref Range   Preg Test, Ur NEGATIVE NEGATIVE  POCT urinalysis dip (device)     Status: Abnormal   Collection Time: 08/31/17  1:46 PM  Result Value Ref Range   Glucose, UA NEGATIVE NEGATIVE mg/dL   Bilirubin Urine NEGATIVE  NEGATIVE   Ketones, ur NEGATIVE NEGATIVE mg/dL   Specific Gravity, Urine 1.020 1.005 - 1.030   Hgb urine dipstick NEGATIVE NEGATIVE   pH 7.0 5.0 - 8.0   Protein, ur NEGATIVE NEGATIVE mg/dL   Urobilinogen, UA 0.2 0.0 - 1.0 mg/dL   Nitrite NEGATIVE NEGATIVE   Leukocytes, UA SMALL (A) NEGATIVE    ASSESSMENT & PLAN:  1. Acute cystitis without hematuria    Meds ordered this encounter  Medications  . cephALEXin (KEFLEX) 500 MG capsule    Sig: Take 1 capsule (500 mg total) by mouth 2 (two) times daily for 7 days.    Dispense:  14 capsule    Refill:  0    Order Specific Question:   Supervising Provider    Answer:   Isa RankinMURRAY, LAURA WILSON [phone][[id number]]  . phenazopyridine (PYRIDIUM) 200 MG tablet    Sig: Take 1 tablet (200 mg total) by mouth 3 (three) times daily.    Dispense:  6 tablet    Refill:  0    Order Specific Question:   Supervising Provider    Answer:   Isa RankinMURRAY, LAURA WILSON [[id number]]   Urine showed signs of infection Urine pregnancy was negative Urine culture sent.  We will call you with the results.   Push fluids and get plenty of rest.    Take antibiotic as directed and to completion Take pyridium as prescribed and as needed for symptomatic relief PCP assistance was initiated.  Follow up with PCP if symptoms persists Return here or go to ER if you have any new or worsening symptoms such as fever, worsening abdominal pain, nausea/vomiting, flank pain  Reviewed expectations re: course of current medical issues. Questions answered. Outlined signs and symptoms indicating need for more acute intervention. Patient verbalized understanding. After Visit Summary given.    Rennis HardingWurst, Nalani Andreen, PA-C 08/31/17 1411

## 2017-08-31 NOTE — ED Triage Notes (Signed)
Lower back pain that started after per period for this month-6/12.  Feeling very tired.  Feels changes in breasts.  Wants pregnancy test.

## 2017-08-31 NOTE — Discharge Instructions (Addendum)
Urine pregnancy was negative Urine culture sent.  We will call you with the results.   Push fluids and get plenty of rest.   Take antibiotic as directed and to completion Take pyridium as prescribed and as needed for symptomatic relief PCP assistance was initiated.  Follow up with PCP if symptoms persists Return here or go to ER if you have any new or worsening symptoms such as fever, worsening abdominal pain, nausea/vomiting, flank pain

## 2017-09-01 LAB — URINE CULTURE: Culture: <10000 — AB

## 2017-09-25 ENCOUNTER — Ambulatory Visit (HOSPITAL_COMMUNITY): Payer: Medicaid Other

## 2017-09-25 ENCOUNTER — Ambulatory Visit (INDEPENDENT_AMBULATORY_CARE_PROVIDER_SITE_OTHER): Payer: Medicaid Other

## 2017-09-25 ENCOUNTER — Ambulatory Visit (HOSPITAL_COMMUNITY)
Admission: EM | Admit: 2017-09-25 | Discharge: 2017-09-25 | Disposition: A | Discharge status: Home / Self Care | Payer: Medicaid Other | Attending: Family Medicine | Admitting: Family Medicine

## 2017-09-25 ENCOUNTER — Encounter (HOSPITAL_COMMUNITY): Payer: Self-pay

## 2017-09-25 DIAGNOSIS — R079 Chest pain, unspecified: Secondary | ICD-10-CM

## 2017-09-25 MED ORDER — RANITIDINE HCL 300 MG PO TABS: 300.0000 mg | ORAL_TABLET | Freq: Every day | ORAL | 0 refills | Status: DC

## 2017-09-25 NOTE — ED Triage Notes (Signed)
Pt presents with chest pain and SOB.  

## 2017-09-25 NOTE — Discharge Instructions (Addendum)
Chest xray and ekg reassuring today.  Will try a daily zantac to see if this is helpful in reducing symptoms.  Please establish with a primary care provider for recheck if no improvement or if symptoms persist.  If develop increased pain, shortness of breath, dizziness, nausea, fevers or otherwise worsening please return or go to Er.

## 2017-09-25 NOTE — ED Provider Notes (Signed)
[facility]    CSN: [id number] Arrival date & time: 09/25/17  1410     History   Chief Complaint Chief Complaint  Patient presents with  . Chest Pain    Chest pain, SOB    HPI Ashley [name] is a [age] y.o. female.   Ashley [name] presents with complaints of intermittent chest pain which is like a shock to the chest which comes and goes. It lasts only seconds and is gone. It can be to left or right chest. Last week had an episode of approximately 3 minutes of shortness of breath. States had two back to back episodes at that time but has not had since. Denies any current chest pain  Or shortness of breath. States has had these chest pains for the past year. Has not worsened. No cough. Not worsened with activity. No nausea, vomiting. Does not worsen with eating. States she does feel she has been under increased stress. No recent hospitalization or broken bone. No recent travel. Not on birth control. Has some numbness to her left arm and at times to her left leg which has not changed. Still with complete ROm and is ambulatory. When the pain does come she rates it 6/10. Hx of asthma, gerd, neck pain.     ROS per HPI.      Past Medical History:  Diagnosis Date  . Asthma     Patient Active Problem List   Diagnosis Date Noted  . Neck pain 08/02/2016  . Encounter for supervision of low-risk pregnancy in third trimester 02/17/2016  . GERD (gastroesophageal reflux disease) 11/13/2015  . Active labor at term 06/21/2013  . Normal delivery 06/21/2013  . Encounter for supervision of other normal pregnancy 11/15/2012    Past Surgical History:  Procedure Laterality Date  . NO PAST SURGERIES      OB History    Gravida  4   Para  3   Term  3   Preterm      AB  1   Living  3     SAB  1   TAB      Ectopic      Multiple  0   Live Births  3            Home Medications    Prior to Admission medications   Medication Sig Start Date End Date Taking?  Authorizing Provider  neomycin-polymyxin-hydrocortisone (CORTISPORIN) 3.5-10000-1 OTIC suspension Place 4 drops into the right ear 3 (three) times daily. 11/19/16   Elvina SidleLauenstein, Kurt, MD  norethindrone-ethinyl estradiol 1/35 (ORTHO-NOVUM 1/35, 28,) tablet Take 1 tablet by mouth daily. 11/10/16   Brock BadHarper, [name], MD  phenazopyridine (PYRIDIUM) 200 MG tablet Take 1 tablet (200 mg total) by mouth 3 (three) times daily. 08/31/17   Wurst, GrenadaBrittany, PA-C  ranitidine (ZANTAC) 300 MG tablet Take 1 tablet (300 mg total) by mouth daily. 09/25/17   Georgetta HaberBurky, Lorea Kupfer B, NP    Family History History reviewed. No pertinent family history.  Social History Social History   Tobacco Use  . Smoking status: Never Smoker  . Smokeless tobacco: Never Used  Substance Use Topics  . Alcohol use: No  . Drug use: No     Allergies   Patient has no known allergies.   Review of Systems Review of Systems   Physical Exam Triage Vital Signs ED Triage Vitals [09/25/17 1500]  Enc Vitals Group     BP 113/71     Pulse Rate 62  Resp 20     Temp 98.1 F (36.7 C)     Temp Source Oral     SpO2 100 %     Weight      Height      Head Circumference      Peak Flow      Pain Score      Pain Loc      Pain Edu?      Excl. in GC?    No data found.  Updated Vital Signs BP 113/71 (BP Location: Left Arm)   Pulse 62   Temp 98.1 F (36.7 C) (Oral)   Resp 20   LMP 09/14/2017   SpO2 100%    Physical Exam  Constitutional: She is oriented to person, place, and time. She appears well-developed and well-nourished. No distress.  Cardiovascular: Normal rate, regular rhythm, normal heart sounds and normal pulses.  Pulmonary/Chest: Effort normal and breath sounds normal. No respiratory distress. She has no decreased breath sounds. She exhibits no mass and no tenderness.  Musculoskeletal: Normal range of motion.  Neurological: She is alert and oriented to person, place, and time. She is not disoriented. No cranial  nerve deficit.  Skin: Skin is warm and dry.   ekg with sinus arrhythmia noted, no acute changes; rate 68.   UC Treatments / Results  Labs (all labs ordered are listed, but only abnormal results are displayed) Labs Reviewed - No data to display  EKG None  Radiology Dg Chest 2 View  Result Date: 09/25/2017 CLINICAL DATA:  Chest pain. EXAM: CHEST - 2 VIEW COMPARISON:  None. FINDINGS: The heart size and mediastinal contours are within normal limits. Both lungs are clear. No pneumothorax or pleural effusion is noted. The visualized skeletal structures are unremarkable. IMPRESSION: No active cardiopulmonary disease. Electronically Signed   By: Lupita Raider, M.D.   On: 09/25/2017 16:09    Procedures Procedures (including critical care time)  Medications Ordered in UC Medications - No data to display  Initial Impression / Assessment and Plan / UC Course  I have reviewed the triage vital signs and the nursing notes.  Pertinent labs & imaging results that were available during my care of the patient were reviewed by me and considered in my medical decision making (see chart for details).     Non toxic, afebrile. Vitals stable. No current symptoms. ekg reassuring. Chest xray obtained and without acute findings. Does endorse being under increased stress, question if this is contributing to symptoms. Will try daily zantac to see if this is helpful. Encouraged establish with PCP for continued evaluation. Return precautions provided. Patient verbalized understanding and agreeable to plan.    Final Clinical Impressions(s) / UC Diagnoses   Final diagnoses:  Nonspecific chest pain     Discharge Instructions     Chest xray and ekg reassuring today.  Will try a daily zantac to see if this is helpful in reducing symptoms.  Please establish with a primary care provider for recheck if no improvement or if symptoms persist.  If develop increased pain, shortness of breath, dizziness, nausea,  fevers or otherwise worsening please return or go to Er.     ED Prescriptions    Medication Sig Dispense Auth. Provider   ranitidine (ZANTAC) 300 MG tablet Take 1 tablet (300 mg total) by mouth daily. 30 tablet Georgetta Haber, NP     Controlled Substance Prescriptions [place] Controlled Substance Registry consulted? Not Applicable   Georgetta Haber, NP 09/25/17 1623

## 2018-05-03 ENCOUNTER — Other Ambulatory Visit: Payer: Self-pay

## 2018-05-03 ENCOUNTER — Emergency Department
Admission: EM | Admit: 2018-05-03 | Discharge: 2018-05-03 | Disposition: A | Discharge status: Home / Self Care | Payer: Medicaid Other | Source: Home / Self Care | Attending: Emergency Medicine | Admitting: Emergency Medicine

## 2018-05-03 ENCOUNTER — Encounter: Payer: Self-pay | Admitting: Emergency Medicine

## 2018-05-03 DIAGNOSIS — J069 Acute upper respiratory infection, unspecified: Secondary | ICD-10-CM | POA: Diagnosis not present

## 2018-05-03 DIAGNOSIS — B9789 Other viral agents as the cause of diseases classified elsewhere: Secondary | ICD-10-CM

## 2018-05-03 LAB — POCT INFLUENZA A/B
Influenza A, POC: NEGATIVE
Influenza B, POC: NEGATIVE

## 2018-05-03 MED ORDER — FLUTICASONE PROPIONATE 50 MCG/ACT NA SUSP: 2.0000 | Freq: Every day | NASAL | 2 refills | Status: DC

## 2018-05-03 NOTE — Discharge Instructions (Signed)
You have a nasal spray for your congestion. Take Tylenol every 6 hours. Try Delsym cough medication.

## 2018-05-03 NOTE — ED Provider Notes (Signed)
[facility]    CSN: [id number] Arrival date & time: 05/03/18  1125     History   Chief Complaint Chief Complaint  Patient presents with  . URI    HPI Ashley [name] is a 33 y.o. female.  Patient was in her usual state of health until 2 weeks ago when she developed an upper respiratory infection.  She treated this with TheraFlu and seemed to improve 48 hours ago she developed myalgias cough runny nose associated with decreased appetite.  Her husband told her she was running a fever but they did not take her temperature.  She continues to feel weak with myalgias. HPI  Past Medical History:  Diagnosis Date  . Asthma     Patient Active Problem List   Diagnosis Date Noted  . Neck pain 08/02/2016  . Encounter for supervision of low-risk pregnancy in third trimester 02/17/2016  . GERD (gastroesophageal reflux disease) 11/13/2015  . Active labor at term 06/21/2013  . Normal delivery 06/21/2013  . Encounter for supervision of other normal pregnancy 11/15/2012    Past Surgical History:  Procedure Laterality Date  . NO PAST SURGERIES      OB History    Gravida  4   Para  3   Term  3   Preterm      AB  1   Living  3     SAB  1   TAB      Ectopic      Multiple  0   Live Births  3            Home Medications    Prior to Admission medications   Medication Sig Start Date End Date Taking? Authorizing Provider  fluticasone (FLONASE) 50 MCG/ACT nasal spray Place 2 sprays into both nostrils daily. 05/03/18   Collene Gobble, MD    Family History History reviewed. No pertinent family history.  Social History Social History   Tobacco Use  . Smoking status: Never Smoker  . Smokeless tobacco: Never Used  Substance Use Topics  . Alcohol use: No  . Drug use: No     Allergies   Patient has no known allergies.   Review of Systems Review of Systems  Constitutional: Positive for fatigue and fever.  HENT: Negative for sore throat.     Respiratory: Positive for cough. Negative for shortness of breath.   Cardiovascular: Negative for chest pain.  Gastrointestinal: Negative.   Musculoskeletal: Positive for myalgias.     Physical Exam Triage Vital Signs ED Triage Vitals  Enc Vitals Group     BP 05/03/18 1223 110/74     Pulse Rate 05/03/18 1223 87     Resp --      Temp 05/03/18 1223 98.3 F (36.8 C)     Temp Source 05/03/18 1223 Oral     SpO2 05/03/18 1223 96 %     Weight 05/03/18 1224 220 lb (99.8 kg)     Height 05/03/18 1224 5\' 8"  (1.727 m)     Head Circumference --      Peak Flow --      Pain Score 05/03/18 1224 4     Pain Loc --      Pain Edu? --      Excl. in GC? --    No data found.  Updated Vital Signs BP 110/74 (BP Location: Right Arm)   Pulse 87   Temp 98.3 F (36.8 C) (Oral)   Ht 5\' 8"  (1.727  m)   Wt 99.8 kg   SpO2 96%   BMI 33.45 kg/m   Visual Acuity Right Eye Distance:   Left Eye Distance:   Bilateral Distance:    Right Eye Near:   Left Eye Near:    Bilateral Near:     Physical Exam Constitutional:      Appearance: Normal appearance.  HENT:     Head: Normocephalic.     Right Ear: Tympanic membrane normal.     Left Ear: Tympanic membrane normal.     Nose: Congestion and rhinorrhea present.     Mouth/Throat:     Pharynx: Oropharynx is clear. No oropharyngeal exudate.  Eyes:     Pupils: Pupils are equal, round, and reactive to light.  Neck:     Musculoskeletal: Normal range of motion and neck supple.  Cardiovascular:     Rate and Rhythm: Normal rate and regular rhythm.  Pulmonary:     Effort: Pulmonary effort is normal.     Breath sounds: Normal breath sounds.  Abdominal:     General: Abdomen is flat.     Palpations: Abdomen is soft.  Musculoskeletal: Normal range of motion.  Skin:    General: Skin is warm and dry.     Capillary Refill: Capillary refill takes less than 2 seconds.  Neurological:     Mental Status: She is alert.      UC Treatments / Results   Labs (all labs ordered are listed, but only abnormal results are displayed) Labs Reviewed  POCT INFLUENZA A/B  POCT INFLUENZA A/B    EKG None  Radiology No results found.  Procedures Procedures (including critical care time)  Medications Ordered in UC Medications - No data to display  Initial Impression / Assessment and Plan / UC Course  I have reviewed the triage vital signs and the nursing notes.  Pertinent labs & imaging results that were available during my care of the patient were reviewed by me and considered in my medical decision making (see chart for details). Flu tests were negative today.  She was not sure whether she was pregnant or not.  Will use Flonase spray as well as Tylenol for her symptoms.  Flu testing done today as stated were negative.  She was given a note for work.     Final Clinical Impressions(s) / UC Diagnoses   Final diagnoses:  Viral URI with cough     Discharge Instructions     You have a nasal spray for your congestion. Take Tylenol every 6 hours. Try Delsym cough medication.     ED Prescriptions    Medication Sig Dispense Auth. Provider   fluticasone (FLONASE) 50 MCG/ACT nasal spray Place 2 sprays into both nostrils daily. 16 g Collene Gobble, MD     Controlled Substance Prescriptions  Controlled Substance Registry consulted? Not Applicable   Collene Gobble, MD 05/03/18 1415

## 2018-05-03 NOTE — ED Triage Notes (Signed)
Headache, fever, runny nose, sore throat x 2 weeks

## 2018-12-03 ENCOUNTER — Other Ambulatory Visit: Payer: Self-pay

## 2018-12-03 ENCOUNTER — Emergency Department
Admission: EM | Admit: 2018-12-03 | Discharge: 2018-12-03 | Disposition: A | Discharge status: Home / Self Care | Payer: Medicaid Other | Source: Home / Self Care

## 2018-12-03 ENCOUNTER — Encounter: Payer: Self-pay | Admitting: *Deleted

## 2018-12-03 DIAGNOSIS — G8929 Other chronic pain: Secondary | ICD-10-CM

## 2018-12-03 DIAGNOSIS — M542 Cervicalgia: Secondary | ICD-10-CM

## 2018-12-03 MED ORDER — MELOXICAM 7.5 MG PO TABS: 7.5000 mg | ORAL_TABLET | Freq: Every day | ORAL | 0 refills | Status: DC

## 2018-12-03 NOTE — ED Provider Notes (Signed)
[facility]    CSN: [id number] Arrival date & time: 12/03/18  1039      History   Chief Complaint Chief Complaint  Patient presents with  . Neck Pain    HPI Ashley [name] is a 33 y.o. female.   HPI Ashley [name] is a 33 y.o. female presenting to UC with c/o bilateral neck and lower back pain, worse in Left arm and left lower back radiating into her hip for over 1 year. She spoke with a family member, who is a doctor, who recommended pt be seen for an MRI.  Pt does not have a PCP.  She has tried Aleve on occasion but no medication taken yesterday or today. Pain is aching and sore, 3/41, worse with certain movements. She also reports some "itching" in her arms and legs when pain is severe. No rashes.  No change in bowel or bladder habits.    Past Medical History:  Diagnosis Date  . Asthma     Patient Active Problem List   Diagnosis Date Noted  . Neck pain 08/02/2016  . Encounter for supervision of low-risk pregnancy in third trimester 02/17/2016  . GERD (gastroesophageal reflux disease) 11/13/2015  . Active labor at term 06/21/2013  . Normal delivery 06/21/2013  . Encounter for supervision of other normal pregnancy 11/15/2012    Past Surgical History:  Procedure Laterality Date  . NO PAST SURGERIES      OB History    Gravida  4   Para  3   Term  3   Preterm      AB  1   Living  3     SAB  1   TAB      Ectopic      Multiple  0   Live Births  3            Home Medications    Prior to Admission medications   Medication Sig Start Date End Date Taking? Authorizing Provider  meloxicam (MOBIC) 7.5 MG tablet Take 1-2 tablets (7.5-15 mg total) by mouth daily. 12/03/18   Noe Gens, PA-C    Family History History reviewed. No pertinent family history.  Social History Social History   Tobacco Use  . Smoking status: Never Smoker  . Smokeless tobacco: Never Used  Substance Use Topics  . Alcohol use: No  . Drug use: No      Allergies   Patient has no known allergies.   Review of Systems Review of Systems  Constitutional: Negative for chills and fever.  Genitourinary: Negative for dysuria, frequency and hematuria.  Musculoskeletal: Positive for arthralgias, back pain, myalgias and neck pain. Negative for gait problem, joint swelling and neck stiffness.  Skin: Negative for color change and rash.  Neurological: Positive for numbness ("itching" in arms and legs when pain severe). Negative for weakness.     Physical Exam Triage Vital Signs ED Triage Vitals  Enc Vitals Group     BP 12/03/18 1059 120/78     Pulse Rate 12/03/18 1059 77     Resp 12/03/18 1059 18     Temp 12/03/18 1059 98.7 F (37.1 C)     Temp Source 12/03/18 1059 Oral     SpO2 12/03/18 1059 100 %     Weight 12/03/18 1100 220 lb (99.8 kg)     Height 12/03/18 1100 5\' 7"  (1.702 m)     Head Circumference --      Peak Flow --  Pain Score 12/03/18 1059 8     Pain Loc --      Pain Edu? --      Excl. in GC? --    No data found.  Updated Vital Signs BP 120/78 (BP Location: Right Arm)   Pulse 77   Temp 98.7 F (37.1 C) (Oral)   Resp 18   Ht 5\' 7"  (1.702 m)   Wt 220 lb (99.8 kg)   LMP 11/12/2018   SpO2 100%   BMI 34.46 kg/m   Visual Acuity Right Eye Distance:   Left Eye Distance:   Bilateral Distance:    Right Eye Near:   Left Eye Near:    Bilateral Near:     Physical Exam Vitals signs and nursing note reviewed.  Constitutional:      Appearance: Normal appearance. She is well-developed.  HENT:     Head: Normocephalic and atraumatic.  Neck:     Musculoskeletal: Normal range of motion and neck supple. Muscular tenderness present. No spinous process tenderness.     Comments: Bilateral cervical muscular tenderness, full ROM neck.  Cardiovascular:     Rate and Rhythm: Normal rate and regular rhythm.  Pulmonary:     Effort: Pulmonary effort is normal.     Breath sounds: Normal breath sounds.  Musculoskeletal:  Normal range of motion.        General: Tenderness present.     Comments: Diffuse tenderness to back. No localized spinal tenderness. Mild tenderness to both hips.  Negative straight leg raise. Normal gait 5/5 strength in upper and lower extremities bilaterally.   Skin:    General: Skin is warm and dry.     Capillary Refill: Capillary refill takes less than 2 seconds.     Findings: No bruising, erythema or rash.  Neurological:     Mental Status: She is alert and oriented to person, place, and time.  Psychiatric:        Behavior: Behavior normal.      UC Treatments / Results  Labs (all labs ordered are listed, but only abnormal results are displayed) Labs Reviewed - No data to display  EKG   Radiology No results found.  Procedures Procedures (including critical care time)  Medications Ordered in UC Medications - No data to display  Initial Impression / Assessment and Plan / UC Course  I have reviewed the triage vital signs and the nursing notes.  Pertinent labs & imaging results that were available during my care of the patient were reviewed by me and considered in my medical decision making (see chart for details).     Hx and exam c/w exacerbation of chronic neck and back pain. No red flag symptoms today. No indication for urgent/emergent imaging.  Offered muscle relaxers, pt declined. Encouraged f/u with Sports Medicine for MRI and/or referral to PT for ongoing chronic pain.  Final Clinical Impressions(s) / UC Diagnoses   Final diagnoses:  Chronic neck pain  Chronic bilateral low back pain with left-sided sciatica     Discharge Instructions      Meloxicam (Mobic) is an antiinflammatory to help with pain and inflammation.  Do not take ibuprofen, Advil, Aleve, or any other medications that contain NSAIDs while taking meloxicam as this may cause stomach upset or even ulcers if taken in large amounts for an extended period of time.   Please call to schedule a  follow up appointment with Sports Medicine and Primary Care for ongoing pain.  No Primary Care Doctor: - Call  [facility] at  [phone] - they can help you locate a primary care doctor that  accepts your insurance, provides certain services, etc. - Physician Referral [phone]- [phone]     ED Prescriptions    Medication Sig Dispense Auth. Provider   meloxicam (MOBIC) 7.5 MG tablet Take 1-2 tablets (7.5-15 mg total) by mouth daily. 20 tablet Lurene ShadowPhelps, Neesa Knapik O, [place]PA-C     PDMP not reviewed this encounter.   Lurene Shadowhelps, Rayyan Burley O, PA-C 12/03/18 1249

## 2018-12-03 NOTE — Discharge Instructions (Signed)
°  Meloxicam (Mobic) is an antiinflammatory to help with pain and inflammation.  Do not take ibuprofen, Advil, Aleve, or any other medications that contain NSAIDs while taking meloxicam as this may cause stomach upset or even ulcers if taken in large amounts for an extended period of time.   Please call to schedule a follow up appointment with Sports Medicine and Primary Care for ongoing pain.  No Primary Care Doctor: Call [facility] at  [phone] - they can help you locate a primary care doctor that  accepts your insurance, provides certain services, etc. Physician Referral Service- [phone]

## 2018-12-03 NOTE — ED Triage Notes (Signed)
Pt c/o neck pain radiates to LT arm with finger numbness and tingling x 1 year. She also c/o LT hip pain radiates to LT leg. She also c/o mucus in throat and chest with itching throat at times.

## 2020-03-07 NOTE — L&D Delivery Note (Signed)
Delivery Note At 7:16 AM a viable female was delivered via Vaginal, Spontaneous (Presentation: Right Occiput Anterior).  APGAR: 8, 9; weight  .   Placenta status: Spontaneous, Intact.  Cord: 3 vessels with the following complications: None.  Cord pH: BA  AROM clear fluid at delivery  Patient IV access was lost while patient was pushing; given 39mu of pitocin IM immediately pp. IV access then gained by RN Anesthesia: None Episiotomy: None Lacerations:  intact Suture Repair:  NA Est. Blood Loss (mL):  100  Mom to postpartum.  Baby to Couplet care / Skin to Skin.  Ashley [name] 02/22/2021, 7:33 AM

## 2020-05-14 ENCOUNTER — Other Ambulatory Visit: Payer: Self-pay

## 2020-05-14 ENCOUNTER — Emergency Department (INDEPENDENT_AMBULATORY_CARE_PROVIDER_SITE_OTHER)
Admission: EM | Admit: 2020-05-14 | Discharge: 2020-05-14 | Disposition: A | Discharge status: Home / Self Care | Payer: Medicaid Other | Source: Home / Self Care

## 2020-05-14 DIAGNOSIS — G8929 Other chronic pain: Secondary | ICD-10-CM | POA: Diagnosis not present

## 2020-05-14 DIAGNOSIS — R202 Paresthesia of skin: Secondary | ICD-10-CM

## 2020-05-14 DIAGNOSIS — R200 Anesthesia of skin: Secondary | ICD-10-CM

## 2020-05-14 DIAGNOSIS — M542 Cervicalgia: Secondary | ICD-10-CM

## 2020-05-14 DIAGNOSIS — M545 Low back pain, unspecified: Secondary | ICD-10-CM

## 2020-05-14 DIAGNOSIS — M5412 Radiculopathy, cervical region: Secondary | ICD-10-CM

## 2020-05-14 LAB — POCT URINALYSIS DIP (MANUAL ENTRY)
Bilirubin, UA: NEGATIVE
Blood, UA: NEGATIVE
Glucose, UA: NEGATIVE mg/dL
Leukocytes, UA: NEGATIVE
Nitrite, UA: NEGATIVE
Protein Ur, POC: NEGATIVE mg/dL
Spec Grav, UA: 1.025 (ref 1.010–1.025)
Urobilinogen, UA: 0.2 E.U./dL
pH, UA: 6.5 (ref 5.0–8.0)

## 2020-05-14 MED ORDER — PREDNISONE 10 MG (21) PO TBPK: ORAL_TABLET | Freq: Every day | ORAL | 0 refills | Status: AC

## 2020-05-14 MED ORDER — IBUPROFEN 800 MG PO TABS: 800.0000 mg | ORAL_TABLET | Freq: Three times a day (TID) | ORAL | 0 refills | Status: DC | PRN
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
## 2020-05-14 NOTE — ED Triage Notes (Addendum)
Pt states she has numbness in both hands. Numbness in back and neck. Pt states she is having lower back pain. Pt is taking tylenol otc but has not helped. Pt also states she has shooting pain down her arms.  Pt states this has been going on for a long time and has been seen in hospital for same issues but the pain is back.   pt states it hurts in her lower abdomin when she urinates but this is not new symptoms

## 2020-05-14 NOTE — Discharge Instructions (Addendum)
I have given you information for the Primary Care office in this building  I have sent in ibuprofen for you to take one tablet every 8 hours as needed for pain and inflammation.  I have sent in a prednisone taper for you to take for 6 days. 6 tablets on day one, 5 tablets on day two, 4 tablets on day three, 3 tablets on day four, 2 tablets on day five, and 1 tablet on day six.  Urine today was negative for infection

## 2020-07-10 ENCOUNTER — Ambulatory Visit: Payer: Medicaid Other

## 2020-07-26 ENCOUNTER — Other Ambulatory Visit: Payer: Self-pay

## 2020-07-26 ENCOUNTER — Encounter: Payer: Self-pay | Admitting: Emergency Medicine

## 2020-07-26 ENCOUNTER — Emergency Department (INDEPENDENT_AMBULATORY_CARE_PROVIDER_SITE_OTHER)
Admission: EM | Admit: 2020-07-26 | Discharge: 2020-07-26 | Disposition: A | Discharge status: Home / Self Care | Payer: Medicaid Other | Source: Home / Self Care

## 2020-07-26 DIAGNOSIS — N912 Amenorrhea, unspecified: Secondary | ICD-10-CM | POA: Diagnosis not present

## 2020-07-26 DIAGNOSIS — R8271 Bacteriuria: Secondary | ICD-10-CM

## 2020-07-26 DIAGNOSIS — Z3200 Encounter for pregnancy test, result unknown: Secondary | ICD-10-CM

## 2020-07-26 DIAGNOSIS — Z3201 Encounter for pregnancy test, result positive: Secondary | ICD-10-CM | POA: Diagnosis not present

## 2020-07-26 DIAGNOSIS — O99891 Other specified diseases and conditions complicating pregnancy: Secondary | ICD-10-CM

## 2020-07-26 LAB — POCT URINALYSIS DIP (MANUAL ENTRY)
Bilirubin, UA: NEGATIVE
Blood, UA: NEGATIVE
Glucose, UA: NEGATIVE mg/dL
Ketones, POC UA: NEGATIVE mg/dL
Nitrite, UA: NEGATIVE
Protein Ur, POC: NEGATIVE mg/dL
Spec Grav, UA: 1.015 (ref 1.010–1.025)
Urobilinogen, UA: 0.2 E.U./dL
pH, UA: 6.5 (ref 5.0–8.0)

## 2020-07-26 LAB — POCT URINE PREGNANCY: Preg Test, Ur: NEGATIVE

## 2020-07-26 MED ORDER — AMOXICILLIN 875 MG PO TABS: 875.0000 mg | ORAL_TABLET | Freq: Two times a day (BID) | ORAL | 0 refills | Status: AC

## 2020-07-26 NOTE — ED Notes (Signed)
Patient's urine pregnancy test was positive; erroneous entry on lab results; patient to be contacted for correct information.

## 2020-07-26 NOTE — ED Notes (Signed)
Correct BP is 106/70

## 2020-07-26 NOTE — Discharge Instructions (Addendum)
Urine pregnancy test today positive. Given white blood cells in your urine start antibiotics as prescribed to cover for possible UTI. Follow-up with OB/Gyn.

## 2020-07-26 NOTE — ED Triage Notes (Signed)
Has just started taking prenatal vitamins.

## 2020-07-26 NOTE — ED Provider Notes (Addendum)
[facility]    CSN: [id number] Arrival date & time: 07/26/20  1234      History   Chief Complaint Chief Complaint  Patient presents with  . Possible Pregnancy    HPI Ashley [name] is a 35 y.o. female.   Patient presents requesting pregnancy testing. She states she has not had a period since around March (she told nurse 4/15). She reports some general fatigue and malaise and took a home pregnancy test that was positive. She is requesting testing to confirm.      Past Medical History:  Diagnosis Date  . Asthma     Patient Active Problem List   Diagnosis Date Noted  . Neck pain 08/02/2016  . Encounter for supervision of low-risk pregnancy in third trimester 02/17/2016  . GERD (gastroesophageal reflux disease) 11/13/2015  . Active labor at term 06/21/2013  . Normal delivery 06/21/2013  . Encounter for supervision of other normal pregnancy 11/15/2012    Past Surgical History:  Procedure Laterality Date  . NO PAST SURGERIES      OB History    Gravida  4   Para  3   Term  3   Preterm      AB  1   Living  3     SAB  1   IAB      Ectopic      Multiple  0   Live Births  3            Home Medications    Prior to Admission medications   Medication Sig Start Date End Date Taking? Authorizing Provider  amoxicillin (AMOXIL) 875 MG tablet Take 1 tablet (875 mg total) by mouth 2 (two) times daily for 5 days. 07/26/20 07/31/20 Yes Efrata Brunner L, PA  Prenatal Vit-Fe Fumarate-FA (PRENATAL MULTIVITAMIN) TABS tablet Take 1 tablet by mouth daily at 12 noon.   Yes [provider]  ibuprofen (ADVIL) 800 MG tablet Take 1 tablet (800 mg total) by mouth every 8 (eight) hours as needed for moderate pain. 05/14/20   Moshe Cipro, NP  meloxicam (MOBIC) 7.5 MG tablet Take 1-2 tablets (7.5-15 mg total) by mouth daily. 12/03/18   Lurene Shadow, PA-C    Family History History reviewed. No pertinent family history.  Social History Social  History   Tobacco Use  . Smoking status: Never Smoker  . Smokeless tobacco: Never Used  Vaping Use  . Vaping Use: Never used  Substance Use Topics  . Alcohol use: No  . Drug use: No     Allergies   Patient has no known allergies.   Review of Systems Review of Systems  Constitutional: Positive for fatigue. Negative for fever.  Respiratory: Negative for shortness of breath.   Cardiovascular: Negative for chest pain.  Gastrointestinal: Negative for abdominal pain, nausea and vomiting.  Genitourinary: Positive for menstrual problem. Negative for dysuria and frequency.  Skin: Negative for rash.  Neurological: Negative for headaches.     Physical Exam Triage Vital Signs ED Triage Vitals  Enc Vitals Group     BP 07/26/20 1336 (!) 106/7     Pulse Rate 07/26/20 1336 81     Resp 07/26/20 1336 16     Temp 07/26/20 1336 98.8 F (37.1 C)     Temp Source 07/26/20 1336 Oral     SpO2 07/26/20 1336 100 %     Weight 07/26/20 1339 225 lb (102.1 kg)     Height 07/26/20 1339 5\' 8"  (1.727  m)     Head Circumference --      Peak Flow --      Pain Score 07/26/20 1339 8     Pain Loc --      Pain Edu? --      Excl. in GC? --    No data found.  Updated Vital Signs BP 106/70   Pulse 81   Temp 98.8 F (37.1 C) (Oral)   Resp 16   Ht 5\' 8"  (1.727 m)   Wt 225 lb (102.1 kg)   LMP 06/19/2020 (Approximate)   SpO2 100%   Breastfeeding No   BMI 34.21 kg/m   Visual Acuity Right Eye Distance:   Left Eye Distance:   Bilateral Distance:    Right Eye Near:   Left Eye Near:    Bilateral Near:     Physical Exam Vitals and nursing note reviewed.  HENT:     Head: Normocephalic.  Eyes:     Pupils: Pupils are equal, round, and reactive to light.  Cardiovascular:     Rate and Rhythm: Normal rate and regular rhythm.     Heart sounds: Normal heart sounds.  Pulmonary:     Effort: Pulmonary effort is normal.     Breath sounds: Normal breath sounds.  Abdominal:     Palpations: Abdomen  is soft.     Tenderness: There is no abdominal tenderness. There is no right CVA tenderness or left CVA tenderness.  Skin:    Findings: No rash.  Neurological:     Mental Status: She is alert.      UC Treatments / Results  Labs (all labs ordered are listed, but only abnormal results are displayed) Labs Reviewed  POCT URINALYSIS DIP (MANUAL ENTRY) - Abnormal; Notable for the following components:      Result Value   Leukocytes, UA Small (1+) (*)    All other components within normal limits  URINE CULTURE  POCT URINE PREGNANCY   Result erroneously entered in chart and unable to change - urine pregnancy positive.   EKG   Radiology No results found.  Procedures Procedures (including critical care time)  Medications Ordered in UC Medications - No data to display  Initial Impression / Assessment and Plan / UC Course  I have reviewed the triage vital signs and the nursing notes.  Pertinent labs & imaging results that were available during my care of the patient were reviewed by me and considered in my medical decision making (see chart for details).     Urine hcg positive. Will send in antibiotics given asymptomatic bacteruria in pregnancy and discussed importance of follow-up for ongoing pregnancy monitoring/management.   E/M: 1 acute uncomplicated illness, 3 data (Urine preg, UA, UCx), moderate risk due to medication management  Final Clinical Impressions(s) / UC Diagnoses   Final diagnoses:  Amenorrhea  Positive pregnancy test  Bacteriuria during pregnancy     Discharge Instructions     Urine pregnancy test today positive. Given white blood cells in your urine start antibiotics as prescribed to cover for possible UTI. Follow-up with OB/Gyn.     ED Prescriptions    Medication Sig Dispense Auth. Provider   amoxicillin (AMOXIL) 875 MG tablet Take 1 tablet (875 mg total) by mouth 2 (two) times daily for 5 days. 10 tablet 06/21/2020, Annalysia Willenbring L, PA     PDMP not  reviewed this encounter.   Vallery Sa, PA 07/26/20 1432    07/28/20, PA 07/26/20 1529  Estanislado Pandy, [place] 07/26/20 [phone]

## 2020-07-26 NOTE — ED Triage Notes (Signed)
Patient here for confirmation of pregnancy. LMP around 06/19/20. Has been fatigued and nauseated but able to function. Para [id number]. Has had covid vaccinations.

## 2020-07-28 LAB — URINE CULTURE
MICRO NUMBER:: 11921453
SPECIMEN QUALITY:: ADEQUATE

## 2020-08-14 ENCOUNTER — Ambulatory Visit (INDEPENDENT_AMBULATORY_CARE_PROVIDER_SITE_OTHER): Payer: Medicaid Other

## 2020-08-14 ENCOUNTER — Other Ambulatory Visit: Payer: Self-pay

## 2020-08-14 VITALS — BP 112/74 | HR 80 | Ht 68.0 in | Wt 222.3 lb

## 2020-08-14 DIAGNOSIS — O3680X0 Pregnancy with inconclusive fetal viability, not applicable or unspecified: Secondary | ICD-10-CM

## 2020-08-14 DIAGNOSIS — Z3481 Encounter for supervision of other normal pregnancy, first trimester: Secondary | ICD-10-CM

## 2020-08-14 DIAGNOSIS — Z3A11 11 weeks gestation of pregnancy: Secondary | ICD-10-CM

## 2020-08-14 DIAGNOSIS — Z3402 Encounter for supervision of normal first pregnancy, second trimester: Secondary | ICD-10-CM | POA: Insufficient documentation

## 2020-08-14 MED ORDER — PRENATE PIXIE 10-0.6-0.4-200 MG PO CAPS: 1.0000 | ORAL_CAPSULE | Freq: Every day | ORAL | 11 refills | Status: DC

## 2020-08-14 MED ORDER — BLOOD PRESSURE KIT DEVI: 1.0000 | 0 refills | Status: DC

## 2020-08-14 MED ORDER — GOJJI WEIGHT SCALE MISC: 1.0000 | 0 refills | Status: DC

## 2020-08-14 NOTE — Progress Notes (Signed)
New OB Intake  I connected with  Ashley [name] on 08/14/20 at  8:15 AM EDT by in person. Video Visit and verified that I am speaking with the correct person using two identifiers. Nurse is located at [facility] and pt is located at [place].  I discussed the limitations, risks, security and privacy concerns of performing an evaluation and management service by telephone and the availability of in person appointments. I also discussed with the patient that there may be a patient responsible charge related to this service. The patient expressed understanding and agreed to proceed.  I explained I am completing New OB Intake today. We discussed her EDD of 02/23/21 that is based on LMP of 05/19/20. Pt is G5/P3013. I reviewed her allergies, medications, Medical/Surgical/OB history, and appropriate screenings. I informed her of [facility] services. Based on history, this is a/an  pregnancy uncomplicated .   Patient Active Problem List   Diagnosis Date Noted   Neck pain 08/02/2016   Encounter for supervision of low-risk pregnancy in third trimester 02/17/2016   GERD (gastroesophageal reflux disease) 11/13/2015   Active labor at term 06/21/2013   Normal delivery 06/21/2013   Encounter for supervision of other normal pregnancy 11/15/2012    Concerns addressed today  Delivery Plans:  Plans to deliver at [facility].   MyChart/Babyscripts MyChart access verified. I explained pt will have some visits in office and some virtually. Babyscripts instructions given and order placed. Patient verifies receipt of registration text/e-mail. Account successfully created and app downloaded.  Blood Pressure Cuff  Blood pressure cuff ordered for patient to pick-up from [employer]. Explained after first prenatal appt pt will check weekly and document in Babyscripts.  Weight scale: Patient    have weight scale. Weight scale ordered   Anatomy US Explained first scheduled [place] will be around 19 weeks. Dating and Viability  scan performed today.  Labs Discussed Avelina Laine genetic screening with patient. Would like both Panorama and Horizon drawn at new OB visit. Routine prenatal labs needed.  Covid Vaccine Patient has covid vaccine.   Social Determinants of Health Food Insecurity: Patient denies food insecurity. WIC Referral: Patient is interested in referral to [facility].  Transportation: Patient denies transportation needs. Childcare: Discussed no children allowed at ultrasound appointments. Offered childcare services; patient declines childcare services at this time.  First visit review I reviewed new OB appt with pt. I explained she will have a pelvic exam, ob bloodwork with genetic screening, and PAP smear. Explained pt will be seen by Coral Ceo at first visit; encounter routed to appropriate provider. Explained that patient will be seen by pregnancy navigator following visit with provider. [facility] information placed in AVS.   Hamilton Capri, RN 08/14/2020  8:14 AM

## 2020-08-17 NOTE — Progress Notes (Signed)
Chart reviewed for nurse visit. Agree with plan of care.   Venora Maples, MD 08/17/20 11:10 AM

## 2020-09-01 ENCOUNTER — Other Ambulatory Visit: Payer: Self-pay

## 2020-09-01 ENCOUNTER — Other Ambulatory Visit (HOSPITAL_COMMUNITY)
Admission: RE | Admit: 2020-09-01 | Discharge: 2020-09-01 | Disposition: A | Discharge status: Home / Self Care | Payer: Medicaid Other | Source: Ambulatory Visit | Attending: Obstetrics | Admitting: Obstetrics

## 2020-09-01 ENCOUNTER — Encounter: Payer: Self-pay | Admitting: Obstetrics

## 2020-09-01 ENCOUNTER — Ambulatory Visit (INDEPENDENT_AMBULATORY_CARE_PROVIDER_SITE_OTHER): Payer: Medicaid Other | Admitting: Obstetrics

## 2020-09-01 VITALS — BP 107/72 | HR 91 | Wt 227.7 lb

## 2020-09-01 DIAGNOSIS — Z3480 Encounter for supervision of other normal pregnancy, unspecified trimester: Secondary | ICD-10-CM

## 2020-09-01 DIAGNOSIS — Z3A15 15 weeks gestation of pregnancy: Secondary | ICD-10-CM | POA: Diagnosis not present

## 2020-09-01 DIAGNOSIS — Z3482 Encounter for supervision of other normal pregnancy, second trimester: Secondary | ICD-10-CM | POA: Diagnosis not present

## 2020-09-01 MED ORDER — VITAFOL ULTRA 29-0.6-0.4-200 MG PO CAPS: 1.0000 | ORAL_CAPSULE | Freq: Every day | ORAL | 4 refills | Status: AC

## 2020-09-01 NOTE — Progress Notes (Addendum)
Subjective:    Ashley [name] is being seen today for her first obstetrical visit.  This is a planned pregnancy. She is at [redacted]w[redacted]d gestation. Her obstetrical history is significant for  none . Relationship with FOB: spouse, living together. Patient does intend to breast feed. Pregnancy history fully reviewed.  The information documented in the HPI was reviewed and verified.  Menstrual History: OB History     Gravida  5   Para  3   Term  3   Preterm      AB  1   Living  3      SAB  1   IAB      Ectopic      Multiple  0   Live Births  3            Patient's last menstrual period was 05/19/2020 (approximate).    Past Medical History:  Diagnosis Date   Asthma     Past Surgical History:  Procedure Laterality Date   NO PAST SURGERIES      (Not in a hospital admission)  No Known Allergies  Social History   Tobacco Use   Smoking status: Never   Smokeless tobacco: Never  Substance Use Topics   Alcohol use: No    History reviewed. No pertinent family history.   Review of Systems Constitutional: negative for weight loss Gastrointestinal: negative for vomiting Genitourinary:negative for genital lesions and vaginal discharge and dysuria Musculoskeletal:negative for back pain Behavioral/Psych: negative for abusive relationship, depression, illegal drug usage and tobacco use    Objective:    BP 107/72   Pulse 91   Wt 227 lb 11.2 oz (103.3 kg)   LMP 05/19/2020 (Approximate)   BMI 34.62 kg/m  FHR 145 bpm (ultrasound) General Appearance:    Alert, cooperative, no distress, appears stated age  Head:    Normocephalic, without obvious abnormality, atraumatic  Eyes:    PERRL, conjunctiva/corneas clear, EOM's intact, fundi    benign, both eyes  Ears:    Normal TM's and external ear canals, both ears  Nose:   Nares normal, septum midline, mucosa normal, no drainage    or sinus tenderness  Throat:   Lips, mucosa, and tongue normal; teeth and gums normal  Neck:    Supple, symmetrical, trachea midline, no adenopathy;    thyroid:  no enlargement/tenderness/nodules; no carotid   bruit or JVD  Back:     Symmetric, no curvature, ROM normal, no CVA tenderness  Lungs:     Clear to auscultation bilaterally, respirations unlabored  Chest Wall:    No tenderness or deformity   Heart:    Regular rate and rhythm, S1 and S2 normal, no murmur, rub   or gallop  Breast Exam:    No tenderness, masses, or nipple abnormality  Abdomen:     Soft, non-tender, bowel sounds active all four quadrants,    no masses, no organomegaly  Genitalia:    Normal female without lesion, discharge or tenderness  Extremities:   Extremities normal, atraumatic, no cyanosis or edema  Pulses:   2+ and symmetric all extremities  Skin:   Skin color, texture, turgor normal, no rashes or lesions  Lymph nodes:   Cervical, supraclavicular, and axillary nodes normal  Neurologic:   CNII-XII intact, normal strength, sensation and reflexes    throughout      Lab Review Urine pregnancy test Labs reviewed yes Radiologic studies reviewed yes  Assessment:    Pregnancy at [redacted]w[redacted]d weeks  Plan:    1. Supervision of other normal pregnancy, antepartum Rx: - Cytology - PAP( [place]) - Cervicovaginal ancillary only( [place]) - Culture, OB Urine - Obstetric Panel, Including HIV - Hepatitis C Antibody - Genetic Screening - AFP, Serum, Open Spina Bifida - Prenat-Fe Poly-Methfol-FA-DHA (VITAFOL ULTRA) 29-0.6-0.4-200 MG CAPS; Take 1 capsule by mouth daily before breakfast.  Dispense: 90 capsule; Refill: 4 - [place] MFM OB DETAIL +14 WK; Future   Prenatal vitamins.  Counseling provided regarding continued use of seat belts, cessation of alcohol consumption, smoking or use of illicit drugs; infection precautions i.e., influenza/TDAP immunizations, toxoplasmosis,CMV, parvovirus, listeria and varicella; workplace safety, exercise during pregnancy; routine dental care, safe medications, sexual activity,  hot tubs, saunas, pools, travel, caffeine use, fish and methlymercury, potential toxins, hair treatments, varicose veins Weight gain recommendations per IOM guidelines reviewed: underweight/BMI< 18.5--> gain 28 - 40 lbs; normal weight/BMI 18.5 - 24.9--> gain 25 - 35 lbs; overweight/BMI 25 - 29.9--> gain 15 - 25 lbs; obese/BMI >30->gain  11 - 20 lbs Problem list reviewed and updated. FIRST/CF mutation testing/NIPT/QUAD SCREEN/fragile X/[name] population testing/Spinal muscular atrophy discussed: requested. Role of ultrasound in pregnancy discussed; fetal survey: requested. Amniocentesis discussed: not indicated.  Meds ordered this encounter  Medications   Prenat-Fe Poly-Methfol-FA-DHA (VITAFOL ULTRA) 29-0.6-0.4-200 MG CAPS    Sig: Take 1 capsule by mouth daily before breakfast.    Dispense:  90 capsule    Refill:  4   Orders Placed This Encounter  Procedures   Culture, OB Urine   [place] MFM OB DETAIL +14 WK    Standing Status:   Future    Standing Expiration Date:   09/01/2021    Order Specific Question:   Reason for Exam (SYMPTOM  OR DIAGNOSIS REQUIRED)    Answer:   Anatomy    Order Specific Question:   Preferred Location    Answer:   [facility] Ultrasound   Obstetric Panel, Including HIV   Hepatitis C Antibody   Genetic Screening   AFP, Serum, Open Spina Bifida    Order Specific Question:   Is patient insulin dependent?    Answer:   No    Order Specific Question:   Weight (lbs)    Answer:   227    Order Specific Question:   Gestational Age (GA), weeks    Answer:   24    Order Specific Question:   Date on which patient was at this GA    Answer:   09/01/2020    Order Specific Question:   GA Calculation Method    Answer:   LMP    Order Specific Question:   GA Date    Answer:   09/01/2020    Order Specific Question:   Number of fetuses    Answer:   1    Follow up in 4 weeks.  I have spent a total of 20 minutes of face-to-face time, excluding clinical staff time, reviewing  notes and preparing to see patient, ordering tests and/or medications, and counseling the patient.   Brock Bad, MD 09/01/2020 2:03 PM

## 2020-09-01 NOTE — Progress Notes (Signed)
Patient presents for Initial prenatal visit. Patient states that she has not been sleeping well and having lower back pain. No other concerns.

## 2020-09-02 ENCOUNTER — Other Ambulatory Visit: Payer: Self-pay | Admitting: Obstetrics

## 2020-09-02 DIAGNOSIS — N760 Acute vaginitis: Secondary | ICD-10-CM

## 2020-09-02 LAB — CERVICOVAGINAL ANCILLARY ONLY
Bacterial Vaginitis (gardnerella): POSITIVE — AB
Candida Glabrata: NEGATIVE
Candida Vaginitis: NEGATIVE
Chlamydia: NEGATIVE
Comment: NEGATIVE
Comment: NEGATIVE
Comment: NEGATIVE
Comment: NEGATIVE
Comment: NEGATIVE
Comment: NORMAL
Neisseria Gonorrhea: NEGATIVE
Trichomonas: NEGATIVE

## 2020-09-02 LAB — CYTOLOGY - PAP
Comment: NEGATIVE
Diagnosis: NEGATIVE
High risk HPV: NEGATIVE

## 2020-09-02 MED ORDER — METRONIDAZOLE 500 MG PO TABS: 500.0000 mg | ORAL_TABLET | Freq: Two times a day (BID) | ORAL | 2 refills | Status: DC

## 2020-09-03 LAB — URINE CULTURE, OB REFLEX

## 2020-09-03 LAB — CULTURE, OB URINE

## 2020-09-09 ENCOUNTER — Encounter: Payer: Self-pay | Admitting: Obstetrics

## 2020-09-09 LAB — AFP, SERUM, OPEN SPINA BIFIDA
AFP MoM: 1.25
AFP Value: 32.6 ng/mL
Gest. Age on Collection Date: 15 weeks
Maternal Age At EDD: 35 yr
OSBR Risk 1 IN: 10000
Test Results:: NEGATIVE
Weight: 227 [lb_av]

## 2020-09-09 LAB — OBSTETRIC PANEL, INCLUDING HIV
Antibody Screen: NEGATIVE
Basophils Absolute: 0 10*3/uL (ref 0.0–0.2)
Basos: 0 %
EOS (ABSOLUTE): 0.5 10*3/uL — ABNORMAL HIGH (ref 0.0–0.4)
Eos: 5 %
HIV Screen 4th Generation wRfx: NONREACTIVE
Hematocrit: 38 % (ref 34.0–46.6)
Hemoglobin: 12.9 g/dL (ref 11.1–15.9)
Hepatitis B Surface Ag: NEGATIVE
Immature Grans (Abs): 0.1 10*3/uL (ref 0.0–0.1)
Immature Granulocytes: 1 %
Lymphocytes Absolute: 2.2 10*3/uL (ref 0.7–3.1)
Lymphs: 26 %
MCH: 30 pg (ref 26.6–33.0)
MCHC: 33.9 g/dL (ref 31.5–35.7)
MCV: 88 fL (ref 79–97)
Monocytes Absolute: 0.6 10*3/uL (ref 0.1–0.9)
Monocytes: 7 %
Neutrophils Absolute: 5.3 10*3/uL (ref 1.4–7.0)
Neutrophils: 61 %
Platelets: 225 10*3/uL (ref 150–450)
RBC: 4.3 x10E6/uL (ref 3.77–5.28)
RDW: 13.4 % (ref 11.7–15.4)
RPR Ser Ql: NONREACTIVE
Rh Factor: POSITIVE
Rubella Antibodies, IGG: 14.3 index (ref >0.99)
WBC: 8.6 10*3/uL (ref 3.4–10.8)

## 2020-09-09 LAB — HEPATITIS C ANTIBODY: Hep C Virus Ab: <0.1 s/co ratio (ref 0.0–0.9)

## 2020-09-10 ENCOUNTER — Encounter: Payer: Self-pay | Admitting: Obstetrics

## 2020-09-29 ENCOUNTER — Ambulatory Visit (INDEPENDENT_AMBULATORY_CARE_PROVIDER_SITE_OTHER): Payer: Medicaid Other | Admitting: Obstetrics & Gynecology

## 2020-09-29 ENCOUNTER — Other Ambulatory Visit: Payer: Self-pay

## 2020-09-29 ENCOUNTER — Encounter: Payer: Medicaid Other | Admitting: Obstetrics & Gynecology

## 2020-09-29 VITALS — BP 122/75 | HR 89 | Wt 231.0 lb

## 2020-09-29 DIAGNOSIS — Z3481 Encounter for supervision of other normal pregnancy, first trimester: Secondary | ICD-10-CM

## 2020-09-29 DIAGNOSIS — O09529 Supervision of elderly multigravida, unspecified trimester: Secondary | ICD-10-CM | POA: Insufficient documentation

## 2020-09-29 MED ORDER — CYCLOBENZAPRINE HCL 5 MG PO TABS: 5.0000 mg | ORAL_TABLET | Freq: Three times a day (TID) | ORAL | 0 refills | Status: DC | PRN

## 2020-09-29 NOTE — Progress Notes (Signed)
   PRENATAL VISIT NOTE  Subjective:  Ashley [name] is a 35 y.o. [id number] at [redacted]w[redacted]d being seen today for ongoing prenatal care.  She is currently monitored for the following issues for this high-risk pregnancy and has Encounter for supervision of normal pregnancy in first trimester on their problem list.  Patient reports backache.  Contractions: Not present. Vag. Bleeding: None.  Movement: Present. Denies leaking of fluid.   The following portions of the patient's history were reviewed and updated as appropriate: allergies, current medications, past family history, past medical history, past social history, past surgical history and problem list.   Objective:   Vitals:   09/29/20 1020  BP: 122/75  Pulse: 89  Weight: 231 lb (104.8 kg)    Fetal Status:     Movement: Present     General:  Alert, oriented and cooperative. Patient is in no acute distress.  Skin: Skin is warm and dry. No rash noted.   Cardiovascular: Normal heart rate noted  Respiratory: Normal respiratory effort, no problems with respiration noted  Abdomen: Soft, gravid, appropriate for gestational age.  Pain/Pressure: Present     Pelvic: Cervical exam deferred        Extremities: Normal range of motion.  Edema: None  Mental Status: Normal mood and affect. Normal behavior. Normal judgment and thought content.   Assessment and Plan:  Pregnancy: [id number] at [redacted]w[redacted]d 1. Encounter for supervision of other normal pregnancy in first trimester Backache and leg cramps, benign exam, responded to medication last pregnancy with similar symptoms, occasional use - cyclobenzaprine (FLEXERIL) 5 MG tablet; Take 1 tablet (5 mg total) by mouth every 8 (eight) hours as needed for muscle spasms.  Dispense: 20 tablet; Refill: 0  Preterm labor symptoms and general obstetric precautions including but not limited to vaginal bleeding, contractions, leaking of fluid and fetal movement were reviewed in detail with the patient. Please refer to After  Visit Summary for other counseling recommendations.   Return in about 4 weeks (around 10/27/2020).  Future Appointments  Date Time Provider Department Center  09/30/2020  9:30 AM [facility] NURSE [facility]  09/30/2020  9:45 AM [facility] US4 [facility]  10/27/2020  1:30 PM Constant, Gigi Gin, MD [facility] None    Scheryl Darter, MD

## 2020-09-29 NOTE — Progress Notes (Signed)
+   fetal movement. Pt states she is not sleeping well and pain in the left leg.

## 2020-09-30 ENCOUNTER — Ambulatory Visit: Payer: Medicaid Other | Attending: Obstetrics

## 2020-09-30 ENCOUNTER — Encounter: Payer: Self-pay | Admitting: *Deleted

## 2020-09-30 ENCOUNTER — Ambulatory Visit: Payer: Medicaid Other | Admitting: *Deleted

## 2020-09-30 ENCOUNTER — Other Ambulatory Visit: Payer: Self-pay

## 2020-09-30 ENCOUNTER — Other Ambulatory Visit: Payer: Self-pay | Admitting: *Deleted

## 2020-09-30 VITALS — BP 107/65 | HR 81

## 2020-09-30 DIAGNOSIS — Z3480 Encounter for supervision of other normal pregnancy, unspecified trimester: Secondary | ICD-10-CM | POA: Insufficient documentation

## 2020-09-30 DIAGNOSIS — E669 Obesity, unspecified: Secondary | ICD-10-CM

## 2020-09-30 DIAGNOSIS — Z3689 Encounter for other specified antenatal screening: Secondary | ICD-10-CM | POA: Insufficient documentation

## 2020-09-30 DIAGNOSIS — Z3A18 18 weeks gestation of pregnancy: Secondary | ICD-10-CM

## 2020-09-30 DIAGNOSIS — O358XX0 Maternal care for other (suspected) fetal abnormality and damage, not applicable or unspecified: Secondary | ICD-10-CM

## 2020-09-30 DIAGNOSIS — O09522 Supervision of elderly multigravida, second trimester: Secondary | ICD-10-CM | POA: Diagnosis not present

## 2020-09-30 DIAGNOSIS — O99212 Obesity complicating pregnancy, second trimester: Secondary | ICD-10-CM | POA: Diagnosis not present

## 2020-10-13 ENCOUNTER — Other Ambulatory Visit: Payer: Self-pay

## 2020-10-13 ENCOUNTER — Inpatient Hospital Stay (HOSPITAL_COMMUNITY)
Admission: AD | Admit: 2020-10-13 | Discharge: 2020-10-13 | Disposition: A | Discharge status: Home / Self Care | Payer: Medicaid Other | Attending: Obstetrics & Gynecology | Admitting: Obstetrics & Gynecology

## 2020-10-13 DIAGNOSIS — Z3A20 20 weeks gestation of pregnancy: Secondary | ICD-10-CM | POA: Insufficient documentation

## 2020-10-13 DIAGNOSIS — R52 Pain, unspecified: Secondary | ICD-10-CM | POA: Diagnosis present

## 2020-10-13 DIAGNOSIS — Z20822 Contact with and (suspected) exposure to covid-19: Secondary | ICD-10-CM | POA: Insufficient documentation

## 2020-10-13 DIAGNOSIS — O210 Mild hyperemesis gravidarum: Secondary | ICD-10-CM | POA: Diagnosis not present

## 2020-10-13 DIAGNOSIS — O219 Vomiting of pregnancy, unspecified: Secondary | ICD-10-CM | POA: Diagnosis not present

## 2020-10-13 DIAGNOSIS — O26892 Other specified pregnancy related conditions, second trimester: Secondary | ICD-10-CM | POA: Insufficient documentation

## 2020-10-13 DIAGNOSIS — O26899 Other specified pregnancy related conditions, unspecified trimester: Secondary | ICD-10-CM

## 2020-10-13 DIAGNOSIS — R531 Weakness: Secondary | ICD-10-CM | POA: Diagnosis not present

## 2020-10-13 LAB — URINALYSIS, ROUTINE W REFLEX MICROSCOPIC
Bilirubin Urine: NEGATIVE
Glucose, UA: NEGATIVE mg/dL
Hgb urine dipstick: NEGATIVE
Ketones, ur: NEGATIVE mg/dL
Nitrite: NEGATIVE
Protein, ur: NEGATIVE mg/dL
Specific Gravity, Urine: 1.013 (ref 1.005–1.030)
pH: 7 (ref 5.0–8.0)

## 2020-10-13 LAB — BASIC METABOLIC PANEL
Anion gap: 8 (ref 5–15)
BUN: <5 mg/dL — ABNORMAL LOW (ref 6–20)
CO2: 23 mmol/L (ref 22–32)
Calcium: 9 mg/dL (ref 8.9–10.3)
Chloride: 106 mmol/L (ref 98–111)
Creatinine, Ser: 0.59 mg/dL (ref 0.44–1.00)
GFR, Estimated: >60 mL/min (ref >60)
Glucose, Bld: 82 mg/dL (ref 70–99)
Potassium: 3.7 mmol/L (ref 3.5–5.1)
Sodium: 137 mmol/L (ref 135–145)

## 2020-10-13 LAB — RESP PANEL BY RT-PCR (FLU A&B, COVID) ARPGX2
Influenza A by PCR: NEGATIVE
Influenza B by PCR: NEGATIVE
SARS Coronavirus 2 by RT PCR: NEGATIVE

## 2020-10-13 LAB — HEMOGLOBIN AND HEMATOCRIT, BLOOD
HCT: 38.9 % (ref 36.0–46.0)
Hemoglobin: 13.3 g/dL (ref 12.0–15.0)

## 2020-10-13 MED ORDER — METOCLOPRAMIDE HCL 10 MG PO TABS: 10.0000 mg | ORAL_TABLET | Freq: Three times a day (TID) | ORAL | 0 refills | Status: DC | PRN

## 2020-10-13 NOTE — MAU Provider Note (Signed)
History     CSN: [id number]  Arrival date and time: 10/13/20 1203   Event Date/Time   First Provider Initiated Contact with Patient 10/13/20 1317      Chief Complaint  Patient presents with   Generalized Body Aches   HPI Ashley [name] is a 35 y.o. [id number] at [redacted]w[redacted]d who presents with body aches, weakness, and nausea. Symptoms have been ongoing with the pregnancy. States she doesn't eat well due to nausea & lack of appetite; typically just eats when she needs to take medication. Also states she doesn't drink much during the day. She's felt weak & achy during the pregnancy - reported to her ob who started her on flexeril but reports that doesn't help with her symptoms.  Denies fever, headache, sore throat, cough, abdominal pain, or vaginal bleeding.   OB History     Gravida  5   Para  3   Term  3   Preterm      AB  1   Living  3      SAB  1   IAB      Ectopic      Multiple  0   Live Births  3           Past Medical History:  Diagnosis Date   Asthma     Past Surgical History:  Procedure Laterality Date   NO PAST SURGERIES      Family History  Problem Relation Age of Onset   Asthma Maternal Grandmother     Social History   Tobacco Use   Smoking status: Never   Smokeless tobacco: Never  Vaping Use   Vaping Use: Never used  Substance Use Topics   Alcohol use: No   Drug use: No    Allergies: No Known Allergies  Medications Prior to Admission  Medication Sig Dispense Refill Last Dose   Blood Pressure Monitoring (BLOOD PRESSURE KIT) DEVI 1 kit by Does not apply route once a week. 1 each 0    cyclobenzaprine (FLEXERIL) 5 MG tablet Take 1 tablet (5 mg total) by mouth every 8 (eight) hours as needed for muscle spasms. 20 tablet 0    metroNIDAZOLE (FLAGYL) 500 MG tablet Take 1 tablet (500 mg total) by mouth 2 (two) times daily. (Patient not taking: Reported on 09/29/2020) 14 tablet 2    Misc. Devices (GOJJI WEIGHT SCALE) MISC 1 Device by Does not  apply route once a week. 1 each 0    Prenat-Fe Poly-Methfol-FA-DHA (VITAFOL ULTRA) 29-0.6-0.4-200 MG CAPS Take 1 capsule by mouth daily before breakfast. 90 capsule 4     Review of Systems  Constitutional:  Positive for appetite change and fatigue. Negative for chills, diaphoresis and fever.  HENT: Negative.    Gastrointestinal:  Positive for nausea and vomiting. Negative for abdominal pain, constipation and diarrhea.  Genitourinary: Negative.   Neurological:  Positive for dizziness and light-headedness. Negative for headaches.  Physical Exam   Blood pressure 108/68, pulse 86, temperature 98.5 F (36.9 C), temperature source Oral, resp. rate 20, height $RemoveBe'5\' 8"'cpEtmhTSD$  (1.727 m), weight 104.1 kg, last menstrual period 05/19/2020, SpO2 100 %.  Physical Exam Vitals and nursing note reviewed.  Constitutional:      General: She is not in acute distress.    Appearance: Normal appearance. She is not ill-appearing or diaphoretic.  HENT:     Head: Normocephalic and atraumatic.  Eyes:     General: No scleral icterus. Cardiovascular:     Rate and Rhythm:  Normal rate and regular rhythm.  Pulmonary:     Effort: Pulmonary effort is normal. No respiratory distress.  Skin:    General: Skin is warm and dry.  Neurological:     Mental Status: She is alert.  Psychiatric:        Mood and Affect: Mood normal.        Behavior: Behavior normal.    MAU Course  Procedures Results for orders placed or performed during the hospital encounter of 10/13/20 (from the past 24 hour(s))  Resp Panel by RT-PCR (Flu A&B, Covid) Nasopharyngeal Swab     Status: None   Collection Time: 10/13/20 12:47 PM   Specimen: Nasopharyngeal Swab; Nasopharyngeal(NP) swabs in vial transport medium  Result Value Ref Range   SARS Coronavirus 2 by RT PCR NEGATIVE NEGATIVE   Influenza A by PCR NEGATIVE NEGATIVE   Influenza B by PCR NEGATIVE NEGATIVE  Urinalysis, Routine w reflex microscopic Urine, Clean Catch     Status: Abnormal    Collection Time: 10/13/20  1:00 PM  Result Value Ref Range   Color, Urine YELLOW YELLOW   APPearance HAZY (A) CLEAR   Specific Gravity, Urine 1.013 1.005 - 1.030   pH 7.0 5.0 - 8.0   Glucose, UA NEGATIVE NEGATIVE mg/dL   Hgb urine dipstick NEGATIVE NEGATIVE   Bilirubin Urine NEGATIVE NEGATIVE   Ketones, ur NEGATIVE NEGATIVE mg/dL   Protein, ur NEGATIVE NEGATIVE mg/dL   Nitrite NEGATIVE NEGATIVE   Leukocytes,Ua MODERATE (A) NEGATIVE   RBC / HPF 0-5 0 - 5 RBC/hpf   WBC, UA 6-10 0 - 5 WBC/hpf   Bacteria, UA RARE (A) NONE SEEN   Squamous Epithelial / LPF 11-20 0 - 5   Mucus PRESENT    Hyaline Casts, UA PRESENT   Basic metabolic panel     Status: Abnormal   Collection Time: 10/13/20  1:45 PM  Result Value Ref Range   Sodium 137 135 - 145 mmol/L   Potassium 3.7 3.5 - 5.1 mmol/L   Chloride 106 98 - 111 mmol/L   CO2 23 22 - 32 mmol/L   Glucose, Bld 82 70 - 99 mg/dL   BUN <5 (L) 6 - 20 mg/dL   Creatinine, Ser 0.59 0.44 - 1.00 mg/dL   Calcium 9.0 8.9 - 10.3 mg/dL   GFR, Estimated >60 >60 mL/min   Anion gap 8 5 - 15  Hemoglobin and hematocrit, blood     Status: None   Collection Time: 10/13/20  1:45 PM  Result Value Ref Range   Hemoglobin 13.3 12.0 - 15.0 g/dL   HCT 38.9 36.0 - 46.0 %    MDM Patient presents with symptoms that have been ongoing during her pregnancy. Symptoms likely stem from under eating due to her nausea. Labs & vitals in MAU are reassuring. Will start on daily reglan & encouraged to increase food & water intake.   Assessment and Plan   1. Pregnancy related nausea, antepartum   2. Weakness   3. [redacted] weeks gestation of pregnancy    -Rx reglan -eats meals/small snacks every 3 hours & get 80 oz of water per day -f/u with OB  Jorje Guild 10/13/2020, 1:17 PM

## 2020-10-13 NOTE — MAU Note (Signed)
Presents stating she had body aches, dizziness and loss of appetite and taste for 2 days.  Denies LOF or VB.  Endorses +FM.

## 2020-10-14 LAB — CULTURE, OB URINE: Culture: <10000 — AB

## 2020-10-27 ENCOUNTER — Ambulatory Visit (INDEPENDENT_AMBULATORY_CARE_PROVIDER_SITE_OTHER): Payer: Medicaid Other | Admitting: Obstetrics and Gynecology

## 2020-10-27 ENCOUNTER — Other Ambulatory Visit: Payer: Self-pay

## 2020-10-27 VITALS — BP 102/69 | HR 82 | Wt 236.0 lb

## 2020-10-27 DIAGNOSIS — O09529 Supervision of elderly multigravida, unspecified trimester: Secondary | ICD-10-CM

## 2020-10-27 DIAGNOSIS — Z3402 Encounter for supervision of normal first pregnancy, second trimester: Secondary | ICD-10-CM

## 2020-10-27 MED ORDER — ASPIRIN EC 81 MG PO TBEC: 81.0000 mg | DELAYED_RELEASE_TABLET | Freq: Every day | ORAL | 2 refills | Status: DC

## 2020-10-27 NOTE — Progress Notes (Signed)
   PRENATAL VISIT NOTE  Subjective:  Ashley [name] is a 35 y.o. [id number] at [redacted]w[redacted]d being seen today for ongoing prenatal care.  She is currently monitored for the following issues for this low-risk pregnancy and has Encounter for supervision of normal first pregnancy in second trimester and Antepartum multigravida of advanced maternal age on their problem list.  Patient reports no complaints.  Contractions: Not present. Vag. Bleeding: None.  Movement: Present. Denies leaking of fluid.   The following portions of the patient's history were reviewed and updated as appropriate: allergies, current medications, past family history, past medical history, past social history, past surgical history and problem list.   Objective:   Vitals:   10/27/20 1329  BP: 102/69  Pulse: 82  Weight: 236 lb (107 kg)    Fetal Status: Fetal Heart Rate (bpm): 152   Movement: Present     General:  Alert, oriented and cooperative. Patient is in no acute distress.  Skin: Skin is warm and dry. No rash noted.   Cardiovascular: Normal heart rate noted  Respiratory: Normal respiratory effort, no problems with respiration noted  Abdomen: Soft, gravid, appropriate for gestational age.  Pain/Pressure: Absent     Pelvic: Cervical exam deferred        Extremities: Normal range of motion.  Edema: None  Mental Status: Normal mood and affect. Normal behavior. Normal judgment and thought content.   Assessment and Plan:  Pregnancy: G5P3013 at [redacted]w[redacted]d 1. Encounter for supervision of normal first pregnancy in second trimester Patient is doing well without complaints Third trimester labs with glucola next visit  2. Antepartum multigravida of advanced maternal age Rx ASA provided Follow up growth ultrasound tomorrow  Preterm labor symptoms and general obstetric precautions including but not limited to vaginal bleeding, contractions, leaking of fluid and fetal movement were reviewed in detail with the patient. Please refer to  After Visit Summary for other counseling recommendations.   Return in about 4 weeks (around 11/24/2020) for 2 hr glucola next visit, in person, ROB, Low risk.  Future Appointments  Date Time Provider Department Center  10/28/2020 10:45 AM [facility] NURSE [facility]  10/28/2020 11:00 AM [facility] US1 [facility]    Catalina Antigua, MD

## 2020-10-27 NOTE — Progress Notes (Signed)
+   Fetal movement. Pt still c/o decreased appetite.

## 2020-10-28 ENCOUNTER — Other Ambulatory Visit (HOSPITAL_COMMUNITY): Payer: Self-pay | Admitting: Maternal & Fetal Medicine

## 2020-10-28 ENCOUNTER — Ambulatory Visit: Payer: Medicaid Other | Attending: Obstetrics and Gynecology

## 2020-10-28 ENCOUNTER — Encounter: Payer: Self-pay | Admitting: *Deleted

## 2020-10-28 ENCOUNTER — Ambulatory Visit: Payer: Medicaid Other | Admitting: *Deleted

## 2020-10-28 VITALS — BP 110/57 | HR 84

## 2020-10-28 DIAGNOSIS — Z3A22 22 weeks gestation of pregnancy: Secondary | ICD-10-CM | POA: Diagnosis not present

## 2020-10-28 DIAGNOSIS — Z363 Encounter for antenatal screening for malformations: Secondary | ICD-10-CM | POA: Insufficient documentation

## 2020-10-28 DIAGNOSIS — O358XX0 Maternal care for other (suspected) fetal abnormality and damage, not applicable or unspecified: Secondary | ICD-10-CM | POA: Diagnosis not present

## 2020-10-28 DIAGNOSIS — Z3402 Encounter for supervision of normal first pregnancy, second trimester: Secondary | ICD-10-CM | POA: Diagnosis present

## 2020-10-28 DIAGNOSIS — O09522 Supervision of elderly multigravida, second trimester: Secondary | ICD-10-CM | POA: Diagnosis present

## 2020-10-28 DIAGNOSIS — O99212 Obesity complicating pregnancy, second trimester: Secondary | ICD-10-CM | POA: Insufficient documentation

## 2020-11-24 ENCOUNTER — Other Ambulatory Visit: Payer: Medicaid Other

## 2020-11-26 ENCOUNTER — Encounter: Payer: Self-pay | Admitting: Obstetrics & Gynecology

## 2020-11-26 ENCOUNTER — Other Ambulatory Visit: Payer: Self-pay

## 2020-11-26 ENCOUNTER — Other Ambulatory Visit: Payer: Medicaid Other

## 2020-11-26 ENCOUNTER — Other Ambulatory Visit (HOSPITAL_COMMUNITY)
Admission: RE | Admit: 2020-11-26 | Discharge: 2020-11-26 | Disposition: A | Discharge status: Home / Self Care | Payer: Medicaid Other | Source: Ambulatory Visit | Attending: Obstetrics & Gynecology | Admitting: Obstetrics & Gynecology

## 2020-11-26 ENCOUNTER — Ambulatory Visit (INDEPENDENT_AMBULATORY_CARE_PROVIDER_SITE_OTHER): Payer: Medicaid Other | Admitting: Obstetrics & Gynecology

## 2020-11-26 VITALS — BP 106/72 | HR 78 | Wt 235.0 lb

## 2020-11-26 DIAGNOSIS — N898 Other specified noninflammatory disorders of vagina: Secondary | ICD-10-CM | POA: Diagnosis not present

## 2020-11-26 DIAGNOSIS — Z3402 Encounter for supervision of normal first pregnancy, second trimester: Secondary | ICD-10-CM

## 2020-11-26 NOTE — Progress Notes (Signed)
ROB [redacted]w[redacted]d  2 hr GTT today.  CC: pain in left leg and lower pelvic pain. Pt notes vaginal itching and irritation. Pt believes may be BV wants to do self swab.

## 2020-11-26 NOTE — Progress Notes (Signed)
   PRENATAL VISIT NOTE  Subjective:  Ashley [name] is a 35 y.o. [id number] at [redacted]w[redacted]d being seen today for ongoing prenatal care.  She is currently monitored for the following issues for this high-risk pregnancy and has Encounter for supervision of normal first pregnancy in second trimester and Antepartum multigravida of advanced maternal age on their problem list.  Patient reports  left side pelvic pressure and leg pain, stretching exercise helps .  Contractions: Irritability. Vag. Bleeding: None.  Movement: Present. Denies leaking of fluid.   The following portions of the patient's history were reviewed and updated as appropriate: allergies, current medications, past family history, past medical history, past social history, past surgical history and problem list.   Objective:   Vitals:   11/26/20 0856  BP: 106/72  Pulse: 78  Weight: 235 lb (106.6 kg)    Fetal Status: Fetal Heart Rate (bpm): 140   Movement: Present     General:  Alert, oriented and cooperative. Patient is in no acute distress.  Skin: Skin is warm and dry. No rash noted.   Cardiovascular: Normal heart rate noted  Respiratory: Normal respiratory effort, no problems with respiration noted  Abdomen: Soft, gravid, appropriate for gestational age.  Pain/Pressure: Present     Pelvic: Cervical exam deferred        Extremities: Normal range of motion.  Edema: None  Mental Status: Normal mood and affect. Normal behavior. Normal judgment and thought content.   Assessment and Plan:  Pregnancy: [id number] at [redacted]w[redacted]d 1. Encounter for supervision of normal first pregnancy in second trimester Routine  - Glucose Tolerance, 2 Hours w/1 Hour - RPR - CBC - HIV Antibody (routine testing w rflx)  2. Vaginal itching Self swab - Cervicovaginal ancillary only( [place])  Preterm labor symptoms and general obstetric precautions including but not limited to vaginal bleeding, contractions, leaking of fluid and fetal movement were reviewed  in detail with the patient. Please refer to After Visit Summary for other counseling recommendations.   Return in about 3 weeks (around 12/17/2020).  Future Appointments  Date Time Provider Department Center  12/10/2020 10:30 AM [facility]SE [facility]  12/10/2020 10:45 AM [facility] US[facility]  12/17/2020 10:35 AM Alysia Penna Marolyn Hammock, MD [facility] None    Scheryl Darter, MD

## 2020-11-27 LAB — GLUCOSE TOLERANCE, 2 HOURS W/ 1HR
Glucose, 1 hour: 65 mg/dL (ref 65–179)
Glucose, 2 hour: 87 mg/dL (ref 65–152)
Glucose, Fasting: 79 mg/dL (ref 65–91)

## 2020-11-27 LAB — CBC
Hematocrit: 35.7 % (ref 34.0–46.6)
Hemoglobin: 12.1 g/dL (ref 11.1–15.9)
MCH: 29.7 pg (ref 26.6–33.0)
MCHC: 33.9 g/dL (ref 31.5–35.7)
MCV: 88 fL (ref 79–97)
Platelets: 223 10*3/uL (ref 150–450)
RBC: 4.07 x10E6/uL (ref 3.77–5.28)
RDW: 13.1 % (ref 11.7–15.4)
WBC: 10.2 10*3/uL (ref 3.4–10.8)

## 2020-11-27 LAB — CERVICOVAGINAL ANCILLARY ONLY
Bacterial Vaginitis (gardnerella): POSITIVE — AB
Candida Glabrata: NEGATIVE
Candida Vaginitis: NEGATIVE
Comment: NEGATIVE
Comment: NEGATIVE
Comment: NEGATIVE

## 2020-11-27 LAB — HIV ANTIBODY (ROUTINE TESTING W REFLEX): HIV Screen 4th Generation wRfx: NONREACTIVE

## 2020-11-27 LAB — RPR: RPR Ser Ql: NONREACTIVE

## 2020-12-02 MED ORDER — METRONIDAZOLE 500 MG PO TABS: 500.0000 mg | ORAL_TABLET | Freq: Two times a day (BID) | ORAL | 0 refills | Status: DC

## 2020-12-02 NOTE — Addendum Note (Signed)
Addended by: Adam Phenix on: 12/02/2020 12:29 PM   Modules accepted: Orders

## 2020-12-10 ENCOUNTER — Encounter: Payer: Self-pay | Admitting: *Deleted

## 2020-12-10 ENCOUNTER — Ambulatory Visit: Payer: Medicaid Other | Admitting: *Deleted

## 2020-12-10 ENCOUNTER — Ambulatory Visit: Payer: Medicaid Other | Attending: Maternal & Fetal Medicine

## 2020-12-10 ENCOUNTER — Other Ambulatory Visit: Payer: Self-pay

## 2020-12-10 VITALS — BP 109/67 | HR 80

## 2020-12-10 DIAGNOSIS — Z3A28 28 weeks gestation of pregnancy: Secondary | ICD-10-CM | POA: Diagnosis not present

## 2020-12-10 DIAGNOSIS — O358XX0 Maternal care for other (suspected) fetal abnormality and damage, not applicable or unspecified: Secondary | ICD-10-CM

## 2020-12-10 DIAGNOSIS — Z3402 Encounter for supervision of normal first pregnancy, second trimester: Secondary | ICD-10-CM

## 2020-12-10 DIAGNOSIS — O09523 Supervision of elderly multigravida, third trimester: Secondary | ICD-10-CM

## 2020-12-10 DIAGNOSIS — E669 Obesity, unspecified: Secondary | ICD-10-CM

## 2020-12-10 DIAGNOSIS — O99213 Obesity complicating pregnancy, third trimester: Secondary | ICD-10-CM | POA: Diagnosis not present

## 2020-12-10 DIAGNOSIS — O09522 Supervision of elderly multigravida, second trimester: Secondary | ICD-10-CM | POA: Diagnosis present

## 2020-12-10 DIAGNOSIS — Z362 Encounter for other antenatal screening follow-up: Secondary | ICD-10-CM | POA: Insufficient documentation

## 2020-12-10 DIAGNOSIS — O99212 Obesity complicating pregnancy, second trimester: Secondary | ICD-10-CM | POA: Diagnosis present

## 2020-12-17 ENCOUNTER — Ambulatory Visit (INDEPENDENT_AMBULATORY_CARE_PROVIDER_SITE_OTHER): Payer: Medicaid Other | Admitting: Obstetrics and Gynecology

## 2020-12-17 ENCOUNTER — Other Ambulatory Visit: Payer: Self-pay

## 2020-12-17 ENCOUNTER — Encounter: Payer: Self-pay | Admitting: Obstetrics and Gynecology

## 2020-12-17 VITALS — BP 106/68 | HR 82 | Wt 235.0 lb

## 2020-12-17 DIAGNOSIS — Z3402 Encounter for supervision of normal first pregnancy, second trimester: Secondary | ICD-10-CM

## 2020-12-17 DIAGNOSIS — Z3009 Encounter for other general counseling and advice on contraception: Secondary | ICD-10-CM

## 2020-12-17 DIAGNOSIS — O09529 Supervision of elderly multigravida, unspecified trimester: Secondary | ICD-10-CM

## 2020-12-17 NOTE — Progress Notes (Signed)
+   Fetal movement. No complaints.  

## 2020-12-17 NOTE — Progress Notes (Signed)
Subjective:  Ashley [name] is a 35 y.o. D6L8756 at [redacted]w[redacted]d being seen today for ongoing prenatal care.  She is currently monitored for the following issues for this high-risk pregnancy and has Encounter for supervision of normal first pregnancy in second trimester; Antepartum multigravida of advanced maternal age; and Unwanted fertility on their problem list.  Patient reports no complaints.  Contractions: Irritability. Vag. Bleeding: None.  Movement: Present. Denies leaking of fluid.   The following portions of the patient's history were reviewed and updated as appropriate: allergies, current medications, past family history, past medical history, past social history, past surgical history and problem list. Problem list updated.  Objective:   Vitals:   12/17/20 1049  BP: 106/68  Pulse: 82  Weight: 235 lb (106.6 kg)    Fetal Status: Fetal Heart Rate (bpm): 144   Movement: Present     General:  Alert, oriented and cooperative. Patient is in no acute distress.  Skin: Skin is warm and dry. No rash noted.   Cardiovascular: Normal heart rate noted  Respiratory: Normal respiratory effort, no problems with respiration noted  Abdomen: Soft, gravid, appropriate for gestational age. Pain/Pressure: Present     Pelvic:  Cervical exam deferred        Extremities: Normal range of motion.  Edema: None  Mental Status: Normal mood and affect. Normal behavior. Normal judgment and thought content.   Urinalysis:      Assessment and Plan:  Pregnancy: E3P2951 at [redacted]w[redacted]d  1. Encounter for supervision of normal first pregnancy in second trimester Stable Declines Flu and Tdap vaccine Growth 37 % on 12/10/20  2. Antepartum multigravida of advanced maternal age Normal genetic testing  3. Unwanted fertility BTL papers signed today  Preterm labor symptoms and general obstetric precautions including but not limited to vaginal bleeding, contractions, leaking of fluid and fetal movement were reviewed in detail  with the patient. Please refer to After Visit Summary for other counseling recommendations.  Return in about 2 weeks (around 12/31/2020) for OB visit, virtual, any provider.   Hermina Staggers, MD

## 2020-12-17 NOTE — Patient Instructions (Signed)

## 2020-12-31 ENCOUNTER — Telehealth (INDEPENDENT_AMBULATORY_CARE_PROVIDER_SITE_OTHER): Payer: Medicaid Other | Admitting: Obstetrics

## 2020-12-31 ENCOUNTER — Other Ambulatory Visit: Payer: Self-pay

## 2020-12-31 ENCOUNTER — Encounter: Payer: Self-pay | Admitting: Obstetrics

## 2020-12-31 DIAGNOSIS — Z3483 Encounter for supervision of other normal pregnancy, third trimester: Secondary | ICD-10-CM

## 2020-12-31 DIAGNOSIS — Z3402 Encounter for supervision of normal first pregnancy, second trimester: Secondary | ICD-10-CM

## 2020-12-31 DIAGNOSIS — Z3A31 31 weeks gestation of pregnancy: Secondary | ICD-10-CM

## 2020-12-31 NOTE — Progress Notes (Signed)
OBSTETRICS PRENATAL VIRTUAL VISIT ENCOUNTER NOTE  [facility] location: [facility]   Patient location: Home  I connected with Ashley [name] on 12/31/20 at 10:35 AM EDT by MyChart Video Encounter and verified that I am speaking with the correct person using two identifiers. I discussed the limitations, risks, security and privacy concerns of performing an evaluation and management service virtually and the availability of in person appointments. I also discussed with the patient that there may be a patient responsible charge related to this service. The patient expressed understanding and agreed to proceed. Subjective:  Ashley [name] is a 35 y.o. H2D9242 at [redacted]w[redacted]d being seen today for ongoing prenatal care.  She is currently monitored for the following issues for this low-risk pregnancy and has Encounter for supervision of normal first pregnancy in second trimester; Antepartum multigravida of advanced maternal age; and Unwanted fertility on their problem list.  Patient reports no complaints.  Contractions: Irritability. Vag. Bleeding: None.  Movement: Present. Denies any leaking of fluid.   The following portions of the patient's history were reviewed and updated as appropriate: allergies, current medications, past family history, past medical history, past social history, past surgical history and problem list.   Objective:   Vitals:   12/31/20 1032  BP: 120/80  Pulse: 86  Weight: 237 lb (107.5 kg)    Fetal Status:     Movement: Present     General:  Alert, oriented and cooperative. Patient is in no acute distress.  Respiratory: Normal respiratory effort, no problems with respiration noted  Mental Status: Normal mood and affect. Normal behavior. Normal judgment and thought content.  Rest of physical exam deferred due to type of encounter  Imaging: [place] MFM OB FOLLOW UP  Result Date:  12/11/2020 ----------------------------------------------------------------------  OBSTETRICS REPORT                        (Signed Final 12/11/2020 07:34 am) ---------------------------------------------------------------------- Patient Info  ID #:       [id number]                          D.O.B.:  May 29, 1985 (34 yrs)  Name:       Ashley [name]                   Visit Date: 12/10/2020 10:58 am ---------------------------------------------------------------------- Performed By  Attending:        Lin Landsman      Secondary Phy.:    [facility]                    MD  Performed By:     Tommie Raymond BS,       Address:           [address]                                                              [address]  [address]                                                              [place]                                                              [address]  Referred By:      Mary Sella              Location:          [facility]                                [facility] at                                                              [facility]  Ref. Address:     [address]                    [place], [place]                    [address] ---------------------------------------------------------------------- Orders  #  Description                           Code        Ordered By  1  [place] MFM OB FOLLOW UP                   62703.50    Lin Landsman ----------------------------------------------------------------------  #  Order #                     Accession #                Episode #  1  [id number]  [id number]                 [id number] ---------------------------------------------------------------------- Indications  [redacted]  weeks gestation of pregnancy                 Z3A.51  Advanced maternal age multigravida 58+,         O21.523  third trimester  Echogenic intracardiac focus of the heart       O35.8XX0  (EIF) (Bilateral)  Obesity complicating pregnancy, third           O99.213  trimester (BMI 33)  Encounter for other antenatal screening         Z36.2  follow-up ---------------------------------------------------------------------- Fetal Evaluation  Num Of Fetuses:          1  Fetal Heart Rate(bpm):   153  Cardiac Activity:        Observed  Presentation:            Cephalic  Placenta:                Anterior  P. Cord Insertion:       Previously Visualized  Amniotic Fluid  AFI FV:      Within normal limits  AFI Sum(cm)     %Tile       Largest Pocket(cm)  21.3            86          6.8  RUQ(cm)       RLQ(cm)       LUQ(cm)        LLQ(cm)  3.6           6.3           6.8            4.6 ---------------------------------------------------------------------- Biometry  BPD:      71.1  mm     G. Age:  28w 4d         37  %    CI:        74.68   %    70 - 86                                                          FL/HC:       21.3  %    19.6 - 20.8  HC:      261.1  mm     G. Age:  28w 3d         15  %    HC/AC:       1.10       0.99 - 1.21  AC:      238.4  mm     G. Age:  28w 1d         30  %    FL/BPD:      78.3  %    71 - 87  FL:       55.7  mm     G. Age:  29w 2d         57  %    FL/AC:       23.4  %    20 - 24  LV:        2.3  mm  Est. FW:  1252   gm    2 lb 12 oz     37  % ---------------------------------------------------------------------- OB History  Gravidity:    5         Term:   3        Prem:   0        SAB:   1  TOP:          0       Ectopic:  0        Living: 3 ---------------------------------------------------------------------- Gestational Age  LMP:           29w 2d        Date:  05/19/20                 EDD:   02/23/21  U/S Today:     28w 4d                                        EDD:   02/28/21  Best:          28w 4d      Det. By:  U/S C R L  (08/14/20)    EDD:   02/28/21 ---------------------------------------------------------------------- Anatomy  Cranium:               Appears normal         LVOT:                   Appears normal  Cavum:                 Appears normal         Aortic Arch:            Appears normal  Ventricles:            Appears normal         Ductal Arch:            Appears normal  Choroid Plexus:        Previously seen        Diaphragm:              Appears normal  Cerebellum:            Previously seen        Stomach:                Appears normal, left                                                                        sided  Posterior Fossa:       Previously seen        Abdomen:                Appears normal  Nuchal Fold:           Previously seen        Abdominal Wall:         Previously seen  Face:                  Orbits and profile     Cord Vessels:  Previously seen                         previously seen  Lips:                  Previously seen        Kidneys:                Appear normal  Palate:                Previously seen        Bladder:                Appears normal  Thoracic:              Appears normal         Spine:                  Appears normal  Heart:                 Appears normal; EIF    Upper Extremities:      Previously seen  RVOT:                  Appears normal         Lower Extremities:      Previously seen  Other:  Heels and 5th digit visualized. VC, 3VV and 3VTV visualized.          Technically difficult due to maternal habitus and fetal position. ---------------------------------------------------------------------- Cervix Uterus Adnexa  Cervix  Normal appearance by transabdominal scan.  Uterus  No abnormality visualized.  Right Ovary  Within normal limits.  Left Ovary  Within normal limits.  Cul De Sac  No free fluid seen.  Adnexa  No abnormality visualized. ---------------------------------------------------------------------- Impression  Follow up growth due to  elevate BMI  Normal interval growth with measurements consistent with  dates  Good fetal movement and amniotic fluid volume ---------------------------------------------------------------------- Recommendations  Follow up as clinically indicated. ----------------------------------------------------------------------               Lin Landsman, MD Electronically Signed Final Report   12/11/2020 07:34 am ----------------------------------------------------------------------   Assessment and Plan:  Pregnancy: P3A2505 at [redacted]w[redacted]d 1. Encounter for supervision of normal first pregnancy in second trimester   Preterm labor symptoms and general obstetric precautions including but not limited to vaginal bleeding, contractions, leaking of fluid and fetal movement were reviewed in detail with the patient. I discussed the assessment and treatment plan with the patient. The patient was provided an opportunity to ask questions and all were answered. The patient agreed with the plan and demonstrated an understanding of the instructions. The patient was advised to call back or seek an in-person office evaluation/go to MAU at [facility] for any urgent or concerning symptoms. Please refer to After Visit Summary for other counseling recommendations.   I have spent a total of 20 minutes of non-face-to-face time, excluding clinical staff time, reviewing notes and preparing to see patient, ordering tests and/or medications, and counseling the patient.   Return in about 2 weeks (around 01/14/2021) for ROB.   Coral Ceo, MD [facility], [facility]

## 2020-12-31 NOTE — Progress Notes (Signed)
I connected with  Ashley [name] on 12/31/20 by a video enabled telemedicine application and verified that I am speaking with the correct person using two identifiers.   I discussed the limitations of evaluation and management by telemedicine. The patient expressed understanding and agreed to proceed.  PATIENT: [name] PROVIDER: [facility]  MyChart OB, c/o living in [place] 40 minutes away from [facility] and is concerned about getting to delivery in [place].

## 2021-01-21 ENCOUNTER — Encounter: Payer: Self-pay | Admitting: Obstetrics and Gynecology

## 2021-01-21 ENCOUNTER — Other Ambulatory Visit: Payer: Self-pay

## 2021-01-21 ENCOUNTER — Ambulatory Visit (INDEPENDENT_AMBULATORY_CARE_PROVIDER_SITE_OTHER): Payer: Medicaid Other | Admitting: Obstetrics and Gynecology

## 2021-01-21 VITALS — BP 107/73 | HR 88 | Wt 245.0 lb

## 2021-01-21 DIAGNOSIS — F32A Depression, unspecified: Secondary | ICD-10-CM

## 2021-01-21 DIAGNOSIS — O99343 Other mental disorders complicating pregnancy, third trimester: Secondary | ICD-10-CM

## 2021-01-21 DIAGNOSIS — Z3009 Encounter for other general counseling and advice on contraception: Secondary | ICD-10-CM

## 2021-01-21 DIAGNOSIS — Z3402 Encounter for supervision of normal first pregnancy, second trimester: Secondary | ICD-10-CM

## 2021-01-21 DIAGNOSIS — Z3A34 34 weeks gestation of pregnancy: Secondary | ICD-10-CM

## 2021-01-21 NOTE — Progress Notes (Signed)
ROB c/o hip pain, Pt is requesting counseling.

## 2021-01-21 NOTE — Progress Notes (Signed)
   PRENATAL VISIT NOTE  Subjective:  Ashley [name] is a 35 y.o. T5V7616 at [redacted]w[redacted]d being seen today for ongoing prenatal care.  She is currently monitored for the following issues for this low-risk pregnancy and has Encounter for supervision of normal first pregnancy in second trimester; Antepartum multigravida of advanced maternal age; and Unwanted fertility on their problem list.  Patient reports  some difficulty going to sleep and some depression symptoms.  Husband has cheated on her and she is having difficulty with her relationship with her mother. Feels down and like she needs somebody to talk to.  Contractions: Irritability. Vag. Bleeding: None.  Movement: Present. Denies leaking of fluid.   The following portions of the patient's history were reviewed and updated as appropriate: allergies, current medications, past family history, past medical history, past social history, past surgical history and problem list.   Objective:   Vitals:   01/21/21 1554  BP: 107/73  Pulse: 88  Weight: 245 lb (111.1 kg)    Fetal Status: Fetal Heart Rate (bpm): 148   Movement: Present     General:  Alert, oriented and cooperative. Patient is in no acute distress.  Skin: Skin is warm and dry. No rash noted.   Cardiovascular: Normal heart rate noted  Respiratory: Normal respiratory effort, no problems with respiration noted  Abdomen: Soft, gravid, appropriate for gestational age.  Pain/Pressure: Present     Pelvic: Cervical exam deferred        Extremities: Normal range of motion.  Edema: Trace  Mental Status: Normal mood and affect. Normal behavior. Normal judgment and thought content.   Assessment and Plan:  Pregnancy: W7P7106 at [redacted]w[redacted]d  1. Encounter for supervision of normal first pregnancy in second trimester  2. Unwanted fertility For BTL  3. [redacted] weeks gestation of pregnancy  4. 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 Please refer to After Visit Summary for other counseling recommendations.   Return in about 2 weeks (around 02/04/2021) for 36 week swabs, low OB, in person.  Future Appointments  Date Time Provider Department Center  01/21/2021  4:10 PM Conan Bowens, MD [facility] None    Conan Bowens, MD

## 2021-02-02 ENCOUNTER — Other Ambulatory Visit: Payer: Self-pay

## 2021-02-02 ENCOUNTER — Ambulatory Visit (INDEPENDENT_AMBULATORY_CARE_PROVIDER_SITE_OTHER): Payer: Medicaid Other | Admitting: Licensed Clinical Social Worker

## 2021-02-02 DIAGNOSIS — O99343 Other mental disorders complicating pregnancy, third trimester: Secondary | ICD-10-CM | POA: Diagnosis not present

## 2021-02-02 DIAGNOSIS — F32A Depression, unspecified: Secondary | ICD-10-CM | POA: Diagnosis not present

## 2021-02-03 NOTE — BH Specialist Note (Signed)
Integrated Behavioral Health Initial In-Person Visit  MRN: [id number] Name: Ashley [name]  Number of Integrated Behavioral Health Clinician visits:: 1/6 Session Start time: 1:00pm  Session End time: 1:45pm Total time: 45  minutes in person at [facility]   Types of Service: Individual psychotherapy  Interpretor:No. Interpretor Name and Language: none    Warm Hand Off Completed.        Subjective: Ashley [name] is a 35 y.o. female accompanied by n/a Patient was referred by Dr. Earlene [name]  for depressive symptoms . Patient reports the following symptoms/concerns: depressed mood, marital conflict, and stress  Duration of problem: approx two year ; Severity of problem: mild  Objective: Mood: Depressed and Affect: Depressed Risk of harm to self or others: No plan to harm self or others  Life Context: Family and Social: lives with spouse and children  School/Work: Healthcare  Self-Care: none Life Changes: new pregnancy   Patient and/or Family's Strengths/Protective Factors: Concrete supports in place (healthy food, safe environments, etc.)  Goals Addressed: Patient will: Reduce symptoms of: depression and stress Increase knowledge and/or ability of: coping skills  Demonstrate ability to: Increase healthy adjustment to current life circumstances  Progress towards Goals: Ongoing  Interventions: Interventions utilized: Supportive Counseling  Standardized Assessments completed: PHQ 9  Patient and/or Family Response: Ashley [name] responded well to scheduled session   Assessment: Patient currently experiencing depression affecting pregnancy.   Patient may benefit from marriage and family counseling.  Plan: Follow up with behavioral health clinician on : as needed  Behavioral recommendations: engage in marriage counseling to address infidelity  Referral(s): Integrated [employer] (In Clinic) "From scale of 1-10, how likely are you to follow plan?":   Ashley Saxon, LCSW

## 2021-02-04 ENCOUNTER — Encounter: Payer: Self-pay | Admitting: Obstetrics and Gynecology

## 2021-02-04 ENCOUNTER — Other Ambulatory Visit: Payer: Self-pay

## 2021-02-04 ENCOUNTER — Other Ambulatory Visit (HOSPITAL_COMMUNITY)
Admission: RE | Admit: 2021-02-04 | Discharge: 2021-02-04 | Disposition: A | Discharge status: Home / Self Care | Payer: Medicaid Other | Source: Ambulatory Visit | Attending: Obstetrics and Gynecology | Admitting: Obstetrics and Gynecology

## 2021-02-04 ENCOUNTER — Ambulatory Visit (INDEPENDENT_AMBULATORY_CARE_PROVIDER_SITE_OTHER): Payer: Medicaid Other | Admitting: Obstetrics and Gynecology

## 2021-02-04 VITALS — BP 108/73 | HR 80 | Wt 241.0 lb

## 2021-02-04 DIAGNOSIS — Z3402 Encounter for supervision of normal first pregnancy, second trimester: Secondary | ICD-10-CM | POA: Diagnosis not present

## 2021-02-04 DIAGNOSIS — Z3009 Encounter for other general counseling and advice on contraception: Secondary | ICD-10-CM

## 2021-02-04 DIAGNOSIS — O09529 Supervision of elderly multigravida, unspecified trimester: Secondary | ICD-10-CM

## 2021-02-04 NOTE — Progress Notes (Signed)
   PRENATAL VISIT NOTE  Subjective:  Ashley [name] is a 35 y.o. Z6X0960 at [redacted]w[redacted]d being seen today for ongoing prenatal care.  She is currently monitored for the following issues for this low-risk pregnancy and has Encounter for supervision of normal first pregnancy in second trimester; Antepartum multigravida of advanced maternal age; and Unwanted fertility on their problem list.  Patient reports no complaints.  Contractions: Irregular. Vag. Bleeding: None.  Movement: Present. Denies leaking of fluid.   The following portions of the patient's history were reviewed and updated as appropriate: allergies, current medications, past family history, past medical history, past social history, past surgical history and problem list.   Objective:   Vitals:   02/04/21 1627  BP: 108/73  Pulse: 80  Weight: 241 lb (109.3 kg)    Fetal Status: Fetal Heart Rate (bpm): 140 Fundal Height: 36 cm Movement: Present     General:  Alert, oriented and cooperative. Patient is in no acute distress.  Skin: Skin is warm and dry. No rash noted.   Cardiovascular: Normal heart rate noted  Respiratory: Normal respiratory effort, no problems with respiration noted  Abdomen: Soft, gravid, appropriate for gestational age.  Pain/Pressure: Present     Pelvic: Cervical exam performed in the presence of a chaperone Dilation: 1 Effacement (%): Thick Station: Ballotable  Extremities: Normal range of motion.     Mental Status: Normal mood and affect. Normal behavior. Normal judgment and thought content.   Assessment and Plan:  Pregnancy: A5W0981 at [redacted]w[redacted]d 1. Encounter for supervision of normal first pregnancy in second trimester Patient is doing well without complaints Cultures today  2. Antepartum multigravida of advanced maternal age Continue ASA  3. Unwanted fertility BTL forms previously signed  Preterm labor symptoms and general obstetric precautions including but not limited to vaginal bleeding, contractions,  leaking of fluid and fetal movement were reviewed in detail with the patient. Please refer to After Visit Summary for other counseling recommendations.   Return in about 2 weeks (around 02/18/2021) for in person, ROB, Low risk.  No future appointments.  Catalina Antigua, MD

## 2021-02-05 LAB — CERVICOVAGINAL ANCILLARY ONLY
Chlamydia: NEGATIVE
Comment: NEGATIVE
Comment: NORMAL
Neisseria Gonorrhea: NEGATIVE

## 2021-02-08 LAB — CULTURE, BETA STREP (GROUP B ONLY): Strep Gp B Culture: NEGATIVE

## 2021-02-10 ENCOUNTER — Other Ambulatory Visit: Payer: Self-pay

## 2021-02-10 ENCOUNTER — Ambulatory Visit (INDEPENDENT_AMBULATORY_CARE_PROVIDER_SITE_OTHER): Payer: Medicaid Other | Admitting: Obstetrics & Gynecology

## 2021-02-10 ENCOUNTER — Encounter: Payer: Self-pay | Admitting: Obstetrics & Gynecology

## 2021-02-10 DIAGNOSIS — Z3402 Encounter for supervision of normal first pregnancy, second trimester: Secondary | ICD-10-CM

## 2021-02-10 NOTE — Progress Notes (Signed)
   PRENATAL VISIT NOTE  Subjective:  Ashley [name] is a 35 y.o. G8Z6629 at [redacted]w[redacted]d being seen today for ongoing prenatal care.  She is currently monitored for the following issues for this low-risk pregnancy and has Encounter for supervision of normal first pregnancy in second trimester; Antepartum multigravida of advanced maternal age; and Unwanted fertility on their problem list.  Patient reports occasional contractions.  Contractions: Irritability. Vag. Bleeding: None.  Movement: Present. Denies leaking of fluid.   The following portions of the patient's history were reviewed and updated as appropriate: allergies, current medications, past family history, past medical history, past social history, past surgical history and problem list.   Objective:   Vitals:   02/10/21 0936  BP: 103/71  Pulse: 79  Weight: 245 lb (111.1 kg)    Fetal Status: Fetal Heart Rate (bpm): 138   Movement: Present     General:  Alert, oriented and cooperative. Patient is in no acute distress.  Skin: Skin is warm and dry. No rash noted.   Cardiovascular: Normal heart rate noted  Respiratory: Normal respiratory effort, no problems with respiration noted  Abdomen: Soft, gravid, appropriate for gestational age.  Pain/Pressure: Present     Pelvic: Cervical exam deferred        Extremities: Normal range of motion.  Edema: None  Mental Status: Normal mood and affect. Normal behavior. Normal judgment and thought content.   Assessment and Plan:  Pregnancy: U7M5465 at [redacted]w[redacted]d 1. Encounter for supervision of normal first pregnancy in second trimester Doing well.Plans BTL  Term labor symptoms and general obstetric precautions including but not limited to vaginal bleeding, contractions, leaking of fluid and fetal movement were reviewed in detail with the patient. Please refer to After Visit Summary for other counseling recommendations.   Return in about 1 week (around 02/17/2021).  No future appointments.  Scheryl Darter, MD

## 2021-02-10 NOTE — Progress Notes (Signed)
ROB [redacted]w[redacted]d GBS: Negative on 02/04/21  Pt declines cervix check today.   CC: None

## 2021-02-17 ENCOUNTER — Other Ambulatory Visit: Payer: Self-pay

## 2021-02-17 ENCOUNTER — Encounter: Payer: Self-pay | Admitting: Obstetrics

## 2021-02-17 ENCOUNTER — Ambulatory Visit (INDEPENDENT_AMBULATORY_CARE_PROVIDER_SITE_OTHER): Payer: Medicaid Other | Admitting: Obstetrics

## 2021-02-17 VITALS — BP 113/76 | HR 93 | Wt 240.0 lb

## 2021-02-17 DIAGNOSIS — O09523 Supervision of elderly multigravida, third trimester: Secondary | ICD-10-CM

## 2021-02-17 NOTE — Progress Notes (Signed)
Subjective:  Ashley [name] is a 35 y.o. P9X5056 at [redacted]w[redacted]d being seen today for ongoing prenatal care.  She is currently monitored for the following issues for this low-risk pregnancy and has Encounter for supervision of normal first pregnancy in second trimester; Antepartum multigravida of advanced maternal age; and Unwanted fertility on their problem list.  Patient reports occasional contractions.  Contractions: Irregular. Vag. Bleeding: None.  Movement: Present. Denies leaking of fluid.   The following portions of the patient's history were reviewed and updated as appropriate: allergies, current medications, past family history, past medical history, past social history, past surgical history and problem list. Problem list updated.  Objective:   Vitals:   02/17/21 1347  BP: 113/76  Pulse: 93  Weight: 240 lb (108.9 kg)    Fetal Status: Fetal Heart Rate (bpm): 141   Movement: Present     General:  Alert, oriented and cooperative. Patient is in no acute distress.  Skin: Skin is warm and dry. No rash noted.   Cardiovascular: Normal heart rate noted  Respiratory: Normal respiratory effort, no problems with respiration noted  Abdomen: Soft, gravid, appropriate for gestational age. Pain/Pressure: Present     Pelvic:  Cervical exam deferred        Extremities: Normal range of motion.  Edema: None  Mental Status: Normal mood and affect. Normal behavior. Normal judgment and thought content.   Urinalysis:      Assessment and Plan:  Pregnancy: [name] at [redacted]w[redacted]d  1. Supervision of elderly multigravida in third trimester    Term labor symptoms and general obstetric precautions including but not limited to vaginal bleeding, contractions, leaking of fluid and fetal movement were reviewed in detail with the patient. Please refer to After Visit Summary for other counseling recommendations.   Return in about 1 week (around 02/24/2021) for ROB.   Brock Bad, MD  02/17/21

## 2021-02-22 ENCOUNTER — Inpatient Hospital Stay (HOSPITAL_COMMUNITY): Payer: Medicaid Other | Admitting: Anesthesiology

## 2021-02-22 ENCOUNTER — Encounter (HOSPITAL_COMMUNITY): Payer: Self-pay | Admitting: Obstetrics and Gynecology

## 2021-02-22 ENCOUNTER — Other Ambulatory Visit: Payer: Self-pay

## 2021-02-22 ENCOUNTER — Encounter (HOSPITAL_COMMUNITY)
Admission: AD | Disposition: A | Discharge status: Home / Self Care | Payer: Self-pay | Source: Home / Self Care | Attending: Obstetrics and Gynecology

## 2021-02-22 ENCOUNTER — Inpatient Hospital Stay (HOSPITAL_COMMUNITY)
Admission: AD | Admit: 2021-02-22 | Discharge: 2021-02-24 | DRG: 798 | Disposition: A | Discharge status: Home / Self Care | Payer: Medicaid Other | Attending: Obstetrics and Gynecology | Admitting: Obstetrics and Gynecology

## 2021-02-22 DIAGNOSIS — Z20822 Contact with and (suspected) exposure to covid-19: Secondary | ICD-10-CM | POA: Diagnosis present

## 2021-02-22 DIAGNOSIS — O26893 Other specified pregnancy related conditions, third trimester: Secondary | ICD-10-CM | POA: Diagnosis present

## 2021-02-22 DIAGNOSIS — Z3A39 39 weeks gestation of pregnancy: Secondary | ICD-10-CM | POA: Diagnosis not present

## 2021-02-22 DIAGNOSIS — Z302 Encounter for sterilization: Secondary | ICD-10-CM

## 2021-02-22 DIAGNOSIS — O479 False labor, unspecified: Secondary | ICD-10-CM | POA: Diagnosis present

## 2021-02-22 DIAGNOSIS — Z9079 Acquired absence of other genital organ(s): Secondary | ICD-10-CM

## 2021-02-22 HISTORY — DX: Other complications of anesthesia, initial encounter: T88.59XA

## 2021-02-22 HISTORY — PX: TUBAL LIGATION: SHX77

## 2021-02-22 LAB — CBC
HCT: 37.5 % (ref 36.0–46.0)
Hemoglobin: 12.7 g/dL (ref 12.0–15.0)
MCH: 29.7 pg (ref 26.0–34.0)
MCHC: 33.9 g/dL (ref 30.0–36.0)
MCV: 87.8 fL (ref 80.0–100.0)
Platelets: 223 10*3/uL (ref 150–400)
RBC: 4.27 MIL/uL (ref 3.87–5.11)
RDW: 14.5 % (ref 11.5–15.5)
WBC: 10.7 10*3/uL — ABNORMAL HIGH (ref 4.0–10.5)
nRBC: 0 % (ref 0.0–0.2)

## 2021-02-22 LAB — RESP PANEL BY RT-PCR (FLU A&B, COVID) ARPGX2
Influenza A by PCR: NEGATIVE
Influenza B by PCR: NEGATIVE
SARS Coronavirus 2 by RT PCR: NEGATIVE

## 2021-02-22 LAB — RPR: RPR Ser Ql: NONREACTIVE

## 2021-02-22 LAB — TYPE AND SCREEN
ABO/RH(D): AB POS
Antibody Screen: NEGATIVE

## 2021-02-22 SURGERY — LIGATION, FALLOPIAN TUBE, POSTPARTUM
Anesthesia: General | Laterality: Bilateral

## 2021-02-22 MED ORDER — FENTANYL CITRATE (PF) 100 MCG/2ML IJ SOLN
INTRAMUSCULAR | Status: DC | PRN
  Administered 2021-02-22 (×2): 50 ug via INTRAVENOUS
  Administered 2021-02-22: 25 ug via INTRAVENOUS

## 2021-02-22 MED ORDER — OXYTOCIN BOLUS FROM INFUSION: 333.0000 mL | Freq: Once | INTRAVENOUS | Status: DC

## 2021-02-22 MED ORDER — COCONUT OIL OIL
1.0000 "application " | TOPICAL_OIL | Status: DC | PRN
  Administered 2021-02-24: 1 via TOPICAL

## 2021-02-22 MED ORDER — OXYTOCIN 10 UNIT/ML IJ SOLN
10.0000 [IU] | Freq: Once | INTRAMUSCULAR | Status: AC
Start: 2021-02-22 — End: 2021-02-22
  Administered 2021-02-22: 07:00:00 10 [IU] via INTRAMUSCULAR

## 2021-02-22 MED ORDER — OXYTOCIN-SODIUM CHLORIDE 30-0.9 UT/500ML-% IV SOLN
2.5000 [IU]/h | INTRAVENOUS | Status: DC
  Filled 2021-02-22: qty 500

## 2021-02-22 MED ORDER — OXYTOCIN 10 UNIT/ML IJ SOLN
INTRAMUSCULAR | Status: AC
  Filled 2021-02-22: qty 1

## 2021-02-22 MED ORDER — WITCH HAZEL-GLYCERIN EX PADS: 1.0000 "application " | MEDICATED_PAD | CUTANEOUS | Status: DC | PRN

## 2021-02-22 MED ORDER — VITAFOL ULTRA 29-0.6-0.4-200 MG PO CAPS: 1.0000 | ORAL_CAPSULE | Freq: Every day | ORAL | Status: DC

## 2021-02-22 MED ORDER — ONDANSETRON HCL 4 MG/2ML IJ SOLN
INTRAMUSCULAR | Status: DC | PRN
  Administered 2021-02-22: 4 mg via INTRAVENOUS

## 2021-02-22 MED ORDER — KETOROLAC TROMETHAMINE 30 MG/ML IJ SOLN
30.0000 mg | Freq: Once | INTRAMUSCULAR | Status: AC | PRN
  Administered 2021-02-22: 14:00:00 30 mg via INTRAVENOUS

## 2021-02-22 MED ORDER — MIDAZOLAM HCL 2 MG/2ML IJ SOLN
INTRAMUSCULAR | Status: DC | PRN
  Administered 2021-02-22: 1 mg via INTRAVENOUS

## 2021-02-22 MED ORDER — ACETAMINOPHEN 325 MG PO TABS
650.0000 mg | ORAL_TABLET | ORAL | Status: DC | PRN
  Administered 2021-02-22 – 2021-02-23 (×2): 650 mg via ORAL
  Filled 2021-02-22 (×2): qty 2

## 2021-02-22 MED ORDER — TETANUS-DIPHTH-ACELL PERTUSSIS 5-2.5-18.5 LF-MCG/0.5 IM SUSY: 0.5000 mL | PREFILLED_SYRINGE | Freq: Once | INTRAMUSCULAR | Status: DC

## 2021-02-22 MED ORDER — DEXAMETHASONE SODIUM PHOSPHATE 10 MG/ML IJ SOLN
INTRAMUSCULAR | Status: AC
  Filled 2021-02-22: qty 1

## 2021-02-22 MED ORDER — LIDOCAINE 2% (20 MG/ML) 5 ML SYRINGE
INTRAMUSCULAR | Status: DC | PRN
  Administered 2021-02-22: 60 mg via INTRAVENOUS

## 2021-02-22 MED ORDER — BENZOCAINE-MENTHOL 20-0.5 % EX AERO: 1.0000 "application " | INHALATION_SPRAY | CUTANEOUS | Status: DC | PRN

## 2021-02-22 MED ORDER — ROCURONIUM BROMIDE 10 MG/ML (PF) SYRINGE
PREFILLED_SYRINGE | INTRAVENOUS | Status: DC | PRN
  Administered 2021-02-22: 20 mg via INTRAVENOUS

## 2021-02-22 MED ORDER — SUCCINYLCHOLINE CHLORIDE 200 MG/10ML IV SOSY
PREFILLED_SYRINGE | INTRAVENOUS | Status: DC | PRN
  Administered 2021-02-22: 200 mg via INTRAVENOUS

## 2021-02-22 MED ORDER — SUCCINYLCHOLINE CHLORIDE 200 MG/10ML IV SOSY
PREFILLED_SYRINGE | INTRAVENOUS | Status: AC
  Filled 2021-02-22: qty 10

## 2021-02-22 MED ORDER — LIDOCAINE 2% (20 MG/ML) 5 ML SYRINGE
INTRAMUSCULAR | Status: AC
  Filled 2021-02-22: qty 5

## 2021-02-22 MED ORDER — PROPOFOL 10 MG/ML IV BOLUS
INTRAVENOUS | Status: AC
  Filled 2021-02-22: qty 20

## 2021-02-22 MED ORDER — FAMOTIDINE 20 MG PO TABS
40.0000 mg | ORAL_TABLET | Freq: Once | ORAL | Status: AC
  Administered 2021-02-22: 11:00:00 40 mg via ORAL
  Filled 2021-02-22: qty 2

## 2021-02-22 MED ORDER — FENTANYL CITRATE (PF) 250 MCG/5ML IJ SOLN
INTRAMUSCULAR | Status: AC
  Filled 2021-02-22: qty 5

## 2021-02-22 MED ORDER — ONDANSETRON HCL 4 MG/2ML IJ SOLN: 4.0000 mg | Freq: Four times a day (QID) | INTRAMUSCULAR | Status: DC | PRN

## 2021-02-22 MED ORDER — ONDANSETRON HCL 4 MG/2ML IJ SOLN
INTRAMUSCULAR | Status: AC
  Filled 2021-02-22: qty 2

## 2021-02-22 MED ORDER — PRENATAL MULTIVITAMIN CH
1.0000 | ORAL_TABLET | Freq: Every day | ORAL | Status: DC
  Administered 2021-02-23 – 2021-02-24 (×2): 1 via ORAL
  Filled 2021-02-22 (×2): qty 1

## 2021-02-22 MED ORDER — FENTANYL CITRATE (PF) 100 MCG/2ML IJ SOLN
INTRAMUSCULAR | Status: AC
  Filled 2021-02-22: qty 2

## 2021-02-22 MED ORDER — LIDOCAINE HCL (PF) 1 % IJ SOLN: 30.0000 mL | INTRAMUSCULAR | Status: DC | PRN

## 2021-02-22 MED ORDER — LACTATED RINGERS IV SOLN: 500.0000 mL | INTRAVENOUS | Status: DC | PRN

## 2021-02-22 MED ORDER — FENTANYL CITRATE (PF) 100 MCG/2ML IJ SOLN
25.0000 ug | INTRAMUSCULAR | Status: DC | PRN
  Administered 2021-02-22: 14:00:00 25 ug via INTRAVENOUS
  Administered 2021-02-22: 14:00:00 50 ug via INTRAVENOUS

## 2021-02-22 MED ORDER — DIPHENHYDRAMINE HCL 25 MG PO CAPS: 25.0000 mg | ORAL_CAPSULE | Freq: Four times a day (QID) | ORAL | Status: DC | PRN

## 2021-02-22 MED ORDER — DIBUCAINE (PERIANAL) 1 % EX OINT: 1.0000 "application " | TOPICAL_OINTMENT | CUTANEOUS | Status: DC | PRN

## 2021-02-22 MED ORDER — ONDANSETRON HCL 4 MG PO TABS: 4.0000 mg | ORAL_TABLET | ORAL | Status: DC | PRN

## 2021-02-22 MED ORDER — SIMETHICONE 80 MG PO CHEW
80.0000 mg | CHEWABLE_TABLET | ORAL | Status: DC | PRN
  Administered 2021-02-22 – 2021-02-23 (×3): 80 mg via ORAL
  Filled 2021-02-22 (×3): qty 1

## 2021-02-22 MED ORDER — OXYCODONE HCL 5 MG PO TABS
5.0000 mg | ORAL_TABLET | Freq: Four times a day (QID) | ORAL | Status: DC | PRN
  Administered 2021-02-23 (×2): 5 mg via ORAL
  Filled 2021-02-22 (×2): qty 1

## 2021-02-22 MED ORDER — ACETAMINOPHEN 325 MG PO TABS: 650.0000 mg | ORAL_TABLET | ORAL | Status: DC | PRN

## 2021-02-22 MED ORDER — ROCURONIUM BROMIDE 10 MG/ML (PF) SYRINGE
PREFILLED_SYRINGE | INTRAVENOUS | Status: AC
  Filled 2021-02-22: qty 10

## 2021-02-22 MED ORDER — FENTANYL CITRATE (PF) 100 MCG/2ML IJ SOLN
50.0000 ug | INTRAMUSCULAR | Status: DC | PRN
  Administered 2021-02-22: 07:00:00 100 ug via INTRAVENOUS
  Filled 2021-02-22: qty 2

## 2021-02-22 MED ORDER — OXYCODONE-ACETAMINOPHEN 5-325 MG PO TABS
1.0000 | ORAL_TABLET | ORAL | Status: DC | PRN
  Administered 2021-02-22: 08:00:00 1 via ORAL
  Filled 2021-02-22: qty 1

## 2021-02-22 MED ORDER — LACTATED RINGERS IV SOLN: INTRAVENOUS | Status: DC

## 2021-02-22 MED ORDER — SOD CITRATE-CITRIC ACID 500-334 MG/5ML PO SOLN: 30.0000 mL | ORAL | Status: DC | PRN

## 2021-02-22 MED ORDER — KETOROLAC TROMETHAMINE 30 MG/ML IJ SOLN
INTRAMUSCULAR | Status: AC
  Filled 2021-02-22: qty 1

## 2021-02-22 MED ORDER — SENNOSIDES-DOCUSATE SODIUM 8.6-50 MG PO TABS
2.0000 | ORAL_TABLET | ORAL | Status: DC
  Administered 2021-02-22 – 2021-02-24 (×3): 2 via ORAL
  Filled 2021-02-22 (×3): qty 2

## 2021-02-22 MED ORDER — SUGAMMADEX SODIUM 200 MG/2ML IV SOLN
INTRAVENOUS | Status: DC | PRN
  Administered 2021-02-22: 200 mg via INTRAVENOUS

## 2021-02-22 MED ORDER — ONDANSETRON HCL 4 MG/2ML IJ SOLN: 4.0000 mg | INTRAMUSCULAR | Status: DC | PRN

## 2021-02-22 MED ORDER — METOCLOPRAMIDE HCL 10 MG PO TABS
10.0000 mg | ORAL_TABLET | Freq: Once | ORAL | Status: AC
  Administered 2021-02-22: 11:00:00 10 mg via ORAL
  Filled 2021-02-22: qty 1

## 2021-02-22 MED ORDER — MIDAZOLAM HCL 2 MG/2ML IJ SOLN
INTRAMUSCULAR | Status: AC
  Filled 2021-02-22: qty 2

## 2021-02-22 MED ORDER — IBUPROFEN 600 MG PO TABS
600.0000 mg | ORAL_TABLET | Freq: Four times a day (QID) | ORAL | Status: DC
  Administered 2021-02-22 – 2021-02-24 (×8): 600 mg via ORAL
  Filled 2021-02-22 (×8): qty 1

## 2021-02-22 MED ORDER — PROPOFOL 10 MG/ML IV BOLUS
INTRAVENOUS | Status: DC | PRN
  Administered 2021-02-22: 20 mg via INTRAVENOUS
  Administered 2021-02-22: 160 mg via INTRAVENOUS

## 2021-02-22 MED ORDER — DEXAMETHASONE SODIUM PHOSPHATE 10 MG/ML IJ SOLN
INTRAMUSCULAR | Status: DC | PRN
  Administered 2021-02-22: 10 mg via INTRAVENOUS

## 2021-02-22 SURGICAL SUPPLY — 21 items
DRSG OPSITE POSTOP 3X4 (GAUZE/BANDAGES/DRESSINGS) ×4 IMPLANT
DURAPREP 26ML APPLICATOR (WOUND CARE) ×4 IMPLANT
GLOVE SURG POLYISO LF SZ7 (GLOVE) ×4 IMPLANT
GLOVE SURG UNDER POLY LF SZ7 (GLOVE) ×8 IMPLANT
GOWN STRL REUS W/TWL LRG LVL3 (GOWN DISPOSABLE) ×4 IMPLANT
NEEDLE HYPO 22GX1.5 SAFETY (NEEDLE) ×4 IMPLANT
NS IRRIG 1000ML POUR BTL (IV SOLUTION) ×4 IMPLANT
PACK ABDOMINAL MINOR (CUSTOM PROCEDURE TRAY) ×4 IMPLANT
PROTECTOR NERVE ULNAR (MISCELLANEOUS) ×4 IMPLANT
SPONGE GAUZE 2X2 8PLY STER LF (GAUZE/BANDAGES/DRESSINGS) ×1
SPONGE GAUZE 2X2 8PLY STRL LF (GAUZE/BANDAGES/DRESSINGS) ×3 IMPLANT
SPONGE LAP 4X18 RFD (DISPOSABLE) ×4 IMPLANT
SUT MON AB 2-0 CT1 36 (SUTURE) ×4 IMPLANT
SUT PLAIN 0 NONE (SUTURE) IMPLANT
SUT VIC AB 0 CT1 27 (SUTURE) ×3
SUT VIC AB 0 CT1 27XBRD ANBCTR (SUTURE) ×2 IMPLANT
SUT VIC AB 3-0 PS2 18 (SUTURE) ×4 IMPLANT
SUT VICRYL 0 UR6 27IN ABS (SUTURE) ×4 IMPLANT
SYR CONTROL 10ML LL (SYRINGE) ×4 IMPLANT
TOWEL OR 17X24 6PK STRL BLUE (TOWEL DISPOSABLE) ×4 IMPLANT
WATER STERILE IRR 1000ML POUR (IV SOLUTION) ×4 IMPLANT

## 2021-02-22 NOTE — MAU Note (Signed)
..  Ashley [name] is a 35 y.o. at [redacted]w[redacted]d here in MAU reporting: Reports contraction every 3 minutes. Denies vaginal bleeding. Reports some mucous-like discharge, no leaking of fluid. +FM.

## 2021-02-22 NOTE — Anesthesia Preprocedure Evaluation (Signed)
Anesthesia Evaluation  Patient identified by MRN, date of birth, ID band Patient awake    Reviewed: Allergy & Precautions, NPO status , Patient's Chart, lab work & pertinent test results  Airway Mallampati: II  TM Distance: >3 FB Neck ROM: Full    Dental no notable dental hx.    Pulmonary asthma ,    Pulmonary exam normal breath sounds clear to auscultation       Cardiovascular negative cardio ROS Normal cardiovascular exam Rhythm:Regular Rate:Normal     Neuro/Psych negative neurological ROS  negative psych ROS   GI/Hepatic negative GI ROS, Neg liver ROS,   Endo/Other  negative endocrine ROS  Renal/GU negative Renal ROS  negative genitourinary   Musculoskeletal negative musculoskeletal ROS (+)   Abdominal   Peds  Hematology negative hematology ROS (+)   Anesthesia Other Findings PPTL  Reproductive/Obstetrics                             Anesthesia Physical Anesthesia Plan  ASA: 2  Anesthesia Plan: General   Post-op Pain Management: Ofirmev IV (intra-op)   Induction: Intravenous and Rapid sequence  PONV Risk Score and Plan: 3 and Midazolam, Dexamethasone and Ondansetron  Airway Management Planned: Oral ETT  Additional Equipment:   Intra-op Plan:   Post-operative Plan: Extubation in OR  Informed Consent: I have reviewed the patients History and Physical, chart, labs and discussed the procedure including the risks, benefits and alternatives for the proposed anesthesia with the patient or authorized representative who has indicated his/her understanding and acceptance.     Dental advisory given  Plan Discussed with: CRNA  Anesthesia Plan Comments: (Pt declines spinal anesthesia. )        Anesthesia Quick Evaluation

## 2021-02-22 NOTE — Transfer of Care (Signed)
Immediate Anesthesia Transfer of Care Note  Patient: Ashley [name]  Procedure(s) Performed: POST PARTUM TUBAL LIGATION (Bilateral)  Patient Location: PACU  Anesthesia Type:General  Level of Consciousness: awake  Airway & Oxygen Therapy: Patient Spontanous Breathing  Post-op Assessment: Report given to RN  Post vital signs: Reviewed and stable  Last Vitals:  Vitals Value Taken Time  BP    Temp    Pulse 79 02/22/21 1254  Resp 15 02/22/21 1254  SpO2 99 % 02/22/21 1254  Vitals shown include unvalidated device data.  Last Pain:  Vitals:   02/22/21 1113  TempSrc:   PainSc: 0-No pain      Patients Stated Pain Goal: 0 (02/22/21 0300)  Complications: No notable events documented.

## 2021-02-22 NOTE — Anesthesia Procedure Notes (Signed)
Procedure Name: Intubation Date/Time: 02/22/2021 10:43 AM Performed by: Asher Muir, CRNA Pre-anesthesia Checklist: Patient identified, Patient being monitored, Timeout performed, Emergency Drugs available and Suction available Patient Re-evaluated:Patient Re-evaluated prior to induction Oxygen Delivery Method: Circle System Utilized Preoxygenation: Pre-oxygenation with 100% oxygen Induction Type: IV induction and Rapid sequence Ventilation: Mask ventilation without difficulty Laryngoscope Size: Mac, 3 and Glidescope Grade View: Grade II Tube type: Oral Tube size: 7.0 mm Number of attempts: 1 Airway Equipment and Method: stylet Placement Confirmation: ETT inserted through vocal cords under direct vision, positive ETCO2 and breath sounds checked- equal and bilateral Secured at: 21 cm Tube secured with: Tape Dental Injury: Teeth and Oropharynx as per pre-operative assessment

## 2021-02-22 NOTE — Op Note (Signed)
Postpartum Tubal Ligation Operative Note   Patient: Ashley [name]  Date of Procedure: 02/22/2021  Procedure: Postpartum bilateral Tubal Ligation via Bilateral salpingectomy   Indications: undesired fertility  Pre-operative Diagnosis: bilateral tubal ligation.   Post-operative Diagnosis: Same and Bilateral Tubal Sterilization via Bilateral salpingectomy  Surgeon: Surgeon(s) and Role:    * Cristela Stalder, Mary Sella, MD - Primary  Assistants: none  An experienced assistant was required given the standard of surgical care given the complexity of the case.  This assistant was needed for exposure, dissection, suctioning, retraction, instrument exchange, assisting with delivery with administration of fundal pressure, and for overall help during the procedure.   Anesthesia: general  Anesthesiologist: Elmer Picker, MD   Antibiotics: None   Estimated Blood Loss: 5 ml   Total IV Fluids: 1000 ml  Urine Output:  10 cc OF clear urine  Specimens: bilateral fallopian tubes to pathology   Complications: no complications   Indications: Ashley [name] is a 35 y.o. [id number] with undesired fertility, status post vaginal delivery, desires permanent sterilization.  Other reversible forms of contraception were discussed with patient; she declines all other modalities. Risks of procedure discussed with patient including but not limited to: risk of regret, permanence of method, bleeding, infection, injury to surrounding organs and need for additional procedures.  Failure risk of 1-2 % with increased risk of ectopic gestation if pregnancy occurs and possibility of post-tubal pain syndrome also discussed with patient.  Findings: Normal uterus, tubes, and ovaries.  Procedure Details: The patient was taken to the operating room where general anesthesia was dosed and found to be adequate.  She was then placed in the dorsal supine position and prepped and draped in sterile fashion.  After an adequate timeout  was performed, attention was turned to the patient's abdomen where a small transverse skin incision was made under the umbilical fold. The incision was taken down to the layer of fascia using the scalpel, and fascia was incised, and the incision was extended bilaterally. The peritoneum was entered in a sharp fashion.   Attention was then turned to the fallopian tubes. Bilateral salpingectomy: A Kelly clamp was placed across the left fallopian tube taking care to incorporate the fimbriae. A second clamp was then placed below the first. The fallopian tube was then removed with Metzenbaum scissors. The pedicle was then ligated with 2-0 Monocryl suture and the second clamp was removed with excellent hemostasis noted. Then a second ligature of 0 Monocryl suture was placed below the remaining clamp, the clamp was then removed and again excellent hemostasis was observed. The same procedure was then carried out on the right fallopian tube with excellent hemostasis noted.  Good hemostasis was noted overall. All laps and instruments were then removed from the patient's abdomen and the fascial incision was repaired with 0 Vicryl, the subcutaneous tissue was closed with 0 Vicryl, and the skin was closed with a 4-0 Vicryl subcuticular stitch. The patient tolerated the procedure well.  Instrument, sponge, and needle counts were correct times three.  The patient was then taken to the recovery room awake and in stable condition.  Disposition: PACU - hemodynamically stable.    Signed: Venora Maples, MD, MPH Center for [facility])

## 2021-02-22 NOTE — Discharge Summary (Signed)
Postpartum Discharge Summary      Patient Name: Ashley [name] DOB: Feb 13, 1986 MRN: [id number]  Date of admission: 02/22/2021 Delivery date:02/22/2021  Delivering provider: Starr Lake  Date of discharge: 02/24/2021  Admitting diagnosis: Uterine contractions during pregnancy [O47.9] Intrauterine pregnancy: [redacted]w[redacted]d    Secondary diagnosis:  Principal Problem:   Uterine contractions during pregnancy Active Problems:   History of bilateral salpingectomy  Additional problems: NA   Discharge diagnosis: Term Pregnancy Delivered                                              Post partum procedures:postpartum tubal ligation Augmentation: AROM Complications: None  Hospital course: Onset of Labor With Vaginal Delivery      35y.o. yo [id number] admitted in Active Labor on 02/22/2021. Patient had an uncomplicated labor course as follows:  Membrane Rupture Time/Date: 7:11 AM ,02/22/2021   Delivery Method:Vaginal, Spontaneous  Episiotomy: None  Lacerations:  None  Patient had an uncomplicated postpartum course.  She is ambulating, tolerating a regular diet, passing flatus, and urinating well. Patient is discharged home in stable condition on 02/24/21.  Newborn Data: Birth date:02/22/2021  Birth time:7:16 AM  Gender:Female  Living status:Living  Apgars:8 ,9  Weight:3345 g   Magnesium Sulfate received: No BMZ received: No Rhophylac:No MMTMA:[id number]-DaP:declined Flu: declined Transfusion:No  Physical exam  Vitals:   02/23/21 1159 02/23/21 1552 02/23/21 2040 02/24/21 0500  BP: (!) 94/51 114/62 (!) 105/57 110/66  Pulse: 80 70 80 78  Resp: _0 Temp: 97.6 F (36.4 C) 98 F (36.7 C) 97.9 F (36.6 C) 98.1 F (36.7 C)  TempSrc: Oral Oral Oral Oral  SpO2: 98% 100% 99% 99%  Weight:      Height:       General: alert, cooperative, and no distress Lochia: appropriate Uterine Fundus: firm Incision: Healing well with no significant  drainage DVT Evaluation: No evidence of DVT seen on physical exam. Labs: Lab Results  Component Value Date   WBC 10.7 (H) 02/22/2021   HGB 12.7 02/22/2021   HCT 37.5 02/22/2021   MCV 87.8 02/22/2021   PLT 223 02/22/2021   CMP Latest Ref Rng & Units 10/13/2020  Glucose 70 - 99 mg/dL 82  BUN 6 - 20 mg/dL <5(L)  Creatinine 0.44 - 1.00 mg/dL 0.59  Sodium 135 - 145 mmol/L 137  Potassium 3.5 - 5.1 mmol/L 3.7  Chloride 98 - 111 mmol/L 106  CO2 22 - 32 mmol/L 23  Calcium 8.9 - 10.3 mg/dL 9.0  Total Protein 6.5 - 8.1 g/dL -  Total Bilirubin 0.3 - 1.2 mg/dL -  Alkaline Phos 38 - 126 U/L -  AST 15 - 41 U/L -  ALT 14 - 54 U/L -   Edinburgh Score: Edinburgh Postnatal Depression Scale Screening Tool 02/22/2021  I have been able to laugh and see the funny side of things. (No Data)     After visit meds:  Allergies as of 02/24/2021   No Known Allergies      Medication List     STOP taking these medications    aspirin 81 MG chewable tablet       TAKE these medications    ibuprofen 600 MG tablet Commonly known as: ADVIL Take 1 tablet (600 mg total) by mouth every 6 (six) hours.   oxyCODONE  5 MG immediate release tablet Commonly known as: Oxy IR/ROXICODONE Take 1 tablet (5 mg total) by mouth every 6 (six) hours as needed for breakthrough pain, moderate pain or severe pain.   Vitafol Ultra 29-0.6-0.4-200 MG Caps Take 1 capsule by mouth daily before breakfast.         Discharge home in stable condition Infant Feeding: Breast Infant Disposition:home with mother Discharge instruction: per After Visit Summary and Postpartum booklet. Activity: Advance as tolerated. Pelvic rest for 6 weeks.  Diet: routine diet Future Appointments: Future Appointments  Date Time Provider [place]  02/25/2021 10:55 AM Woodroe Mode, MD [place] None   Follow up Visit:  [place]. Schedule an appointment as soon as  possible for a visit in 4 week(s).   Specialty: Obstetrics and Gynecology Contact information: [address], [place] [phone]                 Please schedule this patient for a In person postpartum visit in  4-6  with the following provider: APP. Additional Postpartum F/U:None Low risk pregnancy complicated by:  Nothing Delivery mode:  Vaginal, Spontaneous  Anticipated Birth Control:  BTL done [facility]   02/24/2021 Hansel Feinstein, CNM

## 2021-02-22 NOTE — Anesthesia Postprocedure Evaluation (Signed)
Anesthesia Post Note  Patient: Ashley [name]  Procedure(s) Performed: POST PARTUM TUBAL LIGATION (Bilateral)     Patient location during evaluation: PACU Anesthesia Type: General Level of consciousness: awake and alert Pain management: pain level controlled Vital Signs Assessment: post-procedure vital signs reviewed and stable Respiratory status: spontaneous breathing, nonlabored ventilation, respiratory function stable and patient connected to nasal cannula oxygen Cardiovascular status: blood pressure returned to baseline and stable Postop Assessment: no apparent nausea or vomiting Anesthetic complications: no   No notable events documented.  Last Vitals:  Vitals:   02/22/21 1430 02/22/21 1550  BP: 120/73 121/75  Pulse: 74 63  Resp: 16 18  Temp: 36.7 C 37.1 C  SpO2: 100% 98%    Last Pain:  Vitals:   02/22/21 1550  TempSrc: Oral  PainSc: 9    Pain Goal: Patients Stated Pain Goal: 3 (02/22/21 1550)                 Ashley [name]

## 2021-02-22 NOTE — H&P (Signed)
Ashley [name] is a 35 y.o. female presenting for contractions every 5 minutes. Patient was found to be 5cm dilated on initial exam by RN and was admitted by Dr. Donavan Foil. Patient has no other concerns at this time. OB History     Gravida  5   Para  3   Term  3   Preterm      AB  1   Living  3      SAB  1   IAB      Ectopic      Multiple  0   Live Births  3          Past Medical History:  Diagnosis Date   Asthma    Past Surgical History:  Procedure Laterality Date   NO PAST SURGERIES     Family History: family history includes Asthma in her maternal grandmother. Social History:  reports that she has never smoked. She has never used smokeless tobacco. She reports that she does not drink alcohol and does not use drugs.    Maternal Diabetes: No Genetic Screening: Normal Maternal Ultrasounds/Referrals: Normal Fetal Ultrasounds or other Referrals:  None Maternal Substance Abuse:  No Significant Maternal Medications:  None Significant Maternal Lab Results:  None Other Comments:  None  Review of Systems  Constitutional:  Negative for chills, diaphoresis, fatigue and fever.  Eyes:  Negative for visual disturbance.  Respiratory:  Negative for shortness of breath.   Cardiovascular:  Negative for chest pain.  Gastrointestinal:  Negative for abdominal pain, constipation, diarrhea, nausea and vomiting.  Genitourinary:  Negative for dysuria, flank pain, frequency, pelvic pain, urgency, vaginal bleeding and vaginal discharge.  Neurological:  Negative for dizziness, weakness, light-headedness and headaches.  Maternal Medical History:  Reason for admission: Nausea.   Dilation: 5 Effacement (%): 80 Station: -2 Exam by:: Ralene Bathe, RN Blood pressure 116/71, pulse 82, temperature 97.9 F (36.6 C), temperature source Oral, resp. rate 17, last menstrual period 05/19/2020.  Exam  Physical Exam Vitals and nursing note reviewed.  Constitutional:      General: She  is not in acute distress.    Appearance: Normal appearance. She is not ill-appearing, toxic-appearing or diaphoretic.  HENT:     Head: Normocephalic and atraumatic.  Pulmonary:     Effort: Pulmonary effort is normal.  Neurological:     Mental Status: She is alert and oriented to person, place, and time.  Psychiatric:        Mood and Affect: Mood normal.        Behavior: Behavior normal.        Thought Content: Thought content normal.        Judgment: Judgment normal.    EFM: Cat II       -baseline: 140       -variability: minimal/moderate       -accels: present, 10x10       -decels: absent       -TOCO: few ctx  Prenatal labs: ABO, Rh: AB/Positive/-- (06/28 1427) Antibody: Negative (06/28 1427) Rubella: 14.30 (06/28 1427) RPR: Non Reactive (09/22 0927)  HBsAg: Negative (06/28 1427)  HIV: Non Reactive (09/22 0927)  GBS: Negative/-- (12/01 1633)   Assessment/Plan: 1. Labor and delivery, indication for care   2. [redacted] weeks gestation of pregnancy    -admit to L&D  Ashley [name] Ashley [name] 02/22/2021, 3:27 AM

## 2021-02-22 NOTE — Progress Notes (Signed)
Patient desires permanent sterilization.  Other reversible forms of contraception were discussed with patient; she declines all other modalities. Risks of procedure discussed with patient including but not limited to: risk of regret, permanence of method, bleeding, infection, injury to surrounding organs and need for additional procedures.  Failure risk of about 1% with increased risk of ectopic gestation if pregnancy occurs was also discussed with patient.  Also discussed possibility of post-tubal pain syndrome. The patient concurred with the proposed plan, giving informed written consent for the procedures.  Patient has been NPO since last night she will remain NPO for procedure. Anesthesia and OR aware.  

## 2021-02-22 NOTE — Progress Notes (Signed)
Patient ID: Ashley [name], female   DOB: 23-Sep-1985, 35 y.o.   MRN: [id number]   Mardy Hoppe is a 35 y.o. [id number] at [redacted]w[redacted]d  admitted for SOL  Subjective:  Coping well without epidural Objective: Vitals:   02/22/21 0303 02/22/21 0452 02/22/21 0503  BP: 116/71 119/72 119/74  Pulse: 82 79 71  Resp: 17 16 16   Temp: 97.9 F (36.6 C)  98.6 F (37 C)  TempSrc: Oral  Oral  SpO2:   99%  Weight:   109.8 kg  Height:   5\' 8"  (1.727 m)   No intake/output data recorded.  FHT:   135 bpm, mod var, present acel, no decels UC:   irregular, every 1-5 minutes SVE:   Dilation: 6.5 Effacement (%): 90 Station: -2 Exam by:: , RN   Labs: Lab Results  Component Value Date   WBC 10.7 (H) 02/22/2021   HGB 12.7 02/22/2021   HCT 37.5 02/22/2021   MCV 87.8 02/22/2021   PLT 223 02/22/2021    Assessment / Plan: Spontaneous labor, progressing normally  Labor: Progressing normally Fetal Wellbeing:  Category I Pain Control:  Labor support without medications Anticipated MOD:  NSVD  02/24/2021 Jamarri Vuncannon 02/22/2021, 5:17 AM

## 2021-02-23 LAB — SURGICAL PATHOLOGY

## 2021-02-23 MED ORDER — POLYETHYLENE GLYCOL 3350 17 G PO PACK
17.0000 g | PACK | Freq: Every day | ORAL | Status: DC
  Administered 2021-02-23 – 2021-02-24 (×2): 17 g via ORAL
  Filled 2021-02-23 (×2): qty 1

## 2021-02-23 NOTE — Progress Notes (Signed)
POSTPARTUM PROGRESS NOTE  Subjective: Addaleigh Nicholls is a 35 y.o. [id number] PPD#1 s/p SVD at [redacted]w[redacted]d.  She reports she doing well. No acute events overnight. She denies any problems with ambulating, voiding or po intake. Denies nausea or vomiting. She has passed flatus. Pain is moderately controlled.  Lochia is scant.  Objective: Blood pressure 111/75, pulse 61, temperature 97.8 F (36.6 C), temperature source Oral, resp. rate 17, height 5\' 8"  (1.727 m), weight 109.8 kg, last menstrual period 05/19/2020, SpO2 99 %, unknown if currently breastfeeding.  Physical Exam:  General: alert, cooperative and no distress Chest: no respiratory distress Abdomen: soft, non-tender   dressing in place, clean/dry/intact honeycomb dressing at tubal incision Uterine Fundus: firm, appropriately tender Extremities: No calf swelling or tenderness     Recent Labs    02/22/21 0339  HGB 12.7  HCT 37.5    Assessment/Plan: Rini Moffit is a 35 y.o. 31 s/p SVDat [redacted]w[redacted]d.  PPD#1: Doing well, pain well-controlled -- Routine postpartum care -- Encouraged up OOB  Routine Postpartum Care -- Contraception: BTL -- Feeding: breast  Dispo: Plan for discharge on PPD#2.  [redacted]w[redacted]d, MD, MPH OB Fellow, [facility]

## 2021-02-24 MED ORDER — IBUPROFEN 600 MG PO TABS: 600.0000 mg | ORAL_TABLET | Freq: Four times a day (QID) | ORAL | 0 refills | Status: DC

## 2021-02-24 MED ORDER — OXYCODONE HCL 5 MG PO TABS: 5.0000 mg | ORAL_TABLET | Freq: Four times a day (QID) | ORAL | 0 refills | Status: DC | PRN

## 2021-02-25 ENCOUNTER — Encounter: Payer: Medicaid Other | Admitting: Obstetrics & Gynecology

## 2021-03-05 ENCOUNTER — Telehealth (HOSPITAL_COMMUNITY): Payer: Self-pay

## 2021-03-05 NOTE — Telephone Encounter (Signed)
"  I'm doing good." Patient has no questions or concerns about her healing.  "She's good, she is well. Eating and gaining weight. She sleeps in a crib." RN reviewed ABC's of safe sleep with patient. Patient declines any questions or concerns about baby.  EPDS score is 9.  Marcelino Duster [facility] 03/05/2021,1501

## 2021-03-29 ENCOUNTER — Other Ambulatory Visit: Payer: Self-pay | Admitting: Obstetrics and Gynecology

## 2021-03-29 MED ORDER — FLUCONAZOLE 150 MG PO TABS: 150.0000 mg | ORAL_TABLET | ORAL | 0 refills | Status: DC

## 2021-04-07 ENCOUNTER — Encounter: Payer: Self-pay | Admitting: Obstetrics and Gynecology

## 2021-04-07 ENCOUNTER — Ambulatory Visit (INDEPENDENT_AMBULATORY_CARE_PROVIDER_SITE_OTHER): Payer: Medicaid Other | Admitting: Obstetrics and Gynecology

## 2021-04-07 ENCOUNTER — Other Ambulatory Visit: Payer: Self-pay

## 2021-04-07 NOTE — Progress Notes (Signed)
° ° °  Post Partum Visit Note  Ashley [name] is a 36 y.o. [id number] female who presents for a postpartum visit. She is 6 weeks postpartum following a normal spontaneous vaginal delivery.  I have fully reviewed the prenatal and intrapartum course. The delivery was at 39.1 gestational weeks.  Anesthesia: none. Postpartum course has been uncomplicated. Baby is doing well yes. Baby is feeding by breast. Bleeding staining only. Bowel function is abnormal: continued constipation . Bladder function is normal. Patient is not sexually active. Contraception method is tubal ligation. Postpartum depression screening: negative.   The pregnancy intention screening data noted above was reviewed. Potential methods of contraception were discussed. The patient elected to proceed with No data recorded.    Health Maintenance Due  Topic Date Due   COVID-19 Vaccine (1) Never done    Medical records  Review of Systems Pertinent items noted in HPI and remainder of comprehensive ROS otherwise negative.  Objective:  There were no vitals taken for this visit.   General:  alert   Breasts:  not indicated  Lungs: clear to auscultation bilaterally  Heart:  regular rate and rhythm, S1, S2 normal, no murmur, click, rub or gallop  Abdomen: soft, non-tender; bowel sounds normal; no masses,  no organomegaly   Wound NA  GU exam:  not indicated       Assessment:    There are no diagnoses linked to this encounter.  NL postpartum exam.   Plan:   Essential components of care per ACOG recommendations:  1.  Mood and well being: Patient with negative depression screening today. Reviewed local resources for support.  - Patient tobacco use? No.   - hx of drug use? No.    2. Infant care and feeding:  -Patient currently breastmilk feeding? Yes -Social determinants of health (SDOH) reviewed in EPIC. No concerns  3. Sexuality, contraception and birth spacing - Patient does not want a pregnancy in the next year.  Desired  family size is completed - Reviewed forms of contraception in tiered fashion. Patient desired bilateral tubal ligation today.   - Discussed birth spacing of 18 months  4. Sleep and fatigue -Encouraged family/partner/community support of 4 hrs of uninterrupted sleep to help with mood and fatigue  5. Physical Recovery  - Discussed patients delivery and complications. She describes her labor as good. - Patient had a Vaginal, no problems at delivery. Perineal healing reviewed. Patient expressed understanding - Patient has urinary incontinence? No. - Patient is safe to resume physical and sexual activity  6.  Health Maintenance - HM due items addressed Yes - Last pap smear  Diagnosis  Date Value Ref Range Status  09/01/2020   Final   - Negative for intraepithelial lesion or malignancy (NILM)   Pap smear not done at today's visit.  -Breast Cancer screening indicated? No.   7. Chronic Disease/Pregnancy Condition follow up: None   Ashley Elm, MD Center for [facility], [facility]

## 2021-04-07 NOTE — Patient Instructions (Signed)

## 2023-07-23 ENCOUNTER — Emergency Department (HOSPITAL_BASED_OUTPATIENT_CLINIC_OR_DEPARTMENT_OTHER)
Admission: EM | Admit: 2023-07-23 | Discharge: 2023-07-24 | Disposition: A | Discharge status: Home / Self Care | Attending: Emergency Medicine | Admitting: Emergency Medicine

## 2023-07-23 ENCOUNTER — Encounter (HOSPITAL_BASED_OUTPATIENT_CLINIC_OR_DEPARTMENT_OTHER): Payer: Self-pay | Admitting: Emergency Medicine

## 2023-07-23 DIAGNOSIS — L237 Allergic contact dermatitis due to plants, except food: Secondary | ICD-10-CM | POA: Diagnosis not present

## 2023-07-23 DIAGNOSIS — R21 Rash and other nonspecific skin eruption: Secondary | ICD-10-CM | POA: Diagnosis present

## 2023-07-23 MED ORDER — PREDNISONE 20 MG PO TABS
40.0000 mg | ORAL_TABLET | Freq: Once | ORAL | Status: AC
  Administered 2023-07-24: 40 mg via ORAL
  Filled 2023-07-23: qty 2

## 2023-07-23 MED ORDER — HYDROXYZINE HCL 25 MG PO TABS
25.0000 mg | ORAL_TABLET | Freq: Once | ORAL | Status: AC
  Administered 2023-07-24: 25 mg via ORAL
  Filled 2023-07-23: qty 1

## 2023-07-23 NOTE — ED Triage Notes (Signed)
 Pt c/o itchy rash all over since Wednesday

## 2023-07-23 NOTE — ED Provider Notes (Signed)
  [place] [facility] Provider Note   CSN: [id number] Arrival date & time: 07/23/23  2205     History  Chief Complaint  Patient presents with   Rash    Ashley [name] is a 38 y.o. female.  Patient is a 38 year old female with no significant past medical history.  Patient presenting with complaints of rash and itching.  She reports clearing some brush from her backyard last week, then developed an itchy rash to her arms, torso, and neck.  She has been taking over-the-counter medications with little relief.       Home Medications Prior to Admission medications   Medication Sig Start Date End Date Taking? Authorizing Provider  Prenat-Fe Poly-Methfol-FA-DHA (VITAFOL  ULTRA) 29-0.6-0.4-200 MG CAPS Take 1 capsule by mouth daily before breakfast. 09/01/20   Gabrielle Joiner, MD      Allergies    Patient has no known allergies.    Review of Systems   Review of Systems  All other systems reviewed and are negative.   Physical Exam Updated Vital Signs BP 105/66   Pulse 87   Temp 99.3 F (37.4 C) (Oral)   Resp 18   Ht 5\' 7"  (1.702 m)   Wt 102.5 kg   SpO2 100%   Breastfeeding No   BMI 35.40 kg/m  Physical Exam Vitals and nursing note reviewed.  Constitutional:      Appearance: Normal appearance.  HENT:     Head: Normocephalic and atraumatic.  Pulmonary:     Effort: Pulmonary effort is normal.  Skin:    General: Skin is warm and dry.     Comments: There is a macular rash noted to both arms, neck, and torso.  Neurological:     Mental Status: She is alert and oriented to person, place, and time.     ED Results / Procedures / Treatments   Labs (all labs ordered are listed, but only abnormal results are displayed) Labs Reviewed - No data to display  EKG None  Radiology No results found.  Procedures Procedures    Medications Ordered in ED Medications  predniSONE  (DELTASONE ) tablet 40 mg (has no administration in time  range)  hydrOXYzine (ATARAX) tablet 25 mg (has no administration in time range)    ED Course/ Medical Decision Making/ A&P  Presentation and rash most consistent with contact dermatitis related to poison ivy.  Patient will be treated with prednisone  and hydroxyzine.  To follow-up as needed.  Final Clinical Impression(s) / ED Diagnoses Final diagnoses:  None    Rx / DC Orders ED Discharge Orders     None         Orvilla Blander, MD 07/24/23 0000

## 2023-07-24 MED ORDER — PREDNISONE 10 MG PO TABS: 20.0000 mg | ORAL_TABLET | Freq: Two times a day (BID) | ORAL | 0 refills | Status: AC

## 2023-07-24 MED ORDER — HYDROXYZINE HCL 25 MG PO TABS: 25.0000 mg | ORAL_TABLET | Freq: Four times a day (QID) | ORAL | 0 refills | Status: AC

## 2023-07-24 NOTE — Discharge Instructions (Signed)
 Begin taking prednisone  and hydroxyzine as prescribed.  Follow-up with primary doctor if symptoms are not improving in the next few days.
